# Patient Record
Sex: Female | Born: 1955 | Race: White | Hispanic: No | Marital: Married | Smoking: Never smoker
Health system: Southern US, Community
[De-identification: ages and names within clinical notes are randomized; demographics above are authoritative.]

## PROBLEM LIST (undated history)

## (undated) DIAGNOSIS — I341 Nonrheumatic mitral (valve) prolapse: Secondary | ICD-10-CM

## (undated) DIAGNOSIS — I071 Rheumatic tricuspid insufficiency: Secondary | ICD-10-CM

## (undated) DIAGNOSIS — I5189 Other ill-defined heart diseases: Secondary | ICD-10-CM

## (undated) DIAGNOSIS — Z86006 Personal history of melanoma in-situ: Secondary | ICD-10-CM

## (undated) DIAGNOSIS — I493 Ventricular premature depolarization: Secondary | ICD-10-CM

## (undated) DIAGNOSIS — I499 Cardiac arrhythmia, unspecified: Secondary | ICD-10-CM

## (undated) HISTORY — PX: VAGINAL HYSTERECTOMY: SUR661

## (undated) HISTORY — PX: TONSILLECTOMY: SUR1361

## (undated) HISTORY — PX: BREAST CYST ASPIRATION: SHX578

## (undated) HISTORY — PX: APPENDECTOMY: SHX54

---

## 1998-01-09 ENCOUNTER — Inpatient Hospital Stay (HOSPITAL_COMMUNITY): Admission: EM | Admit: 1998-01-09 | Discharge: 1998-01-14 | Payer: Self-pay | Admitting: *Deleted

## 2004-01-04 ENCOUNTER — Ambulatory Visit: Payer: Self-pay | Admitting: Internal Medicine

## 2005-02-08 ENCOUNTER — Ambulatory Visit: Payer: Self-pay | Admitting: Internal Medicine

## 2006-04-03 ENCOUNTER — Ambulatory Visit: Payer: Self-pay | Admitting: Internal Medicine

## 2007-05-27 ENCOUNTER — Ambulatory Visit: Payer: Self-pay | Admitting: Internal Medicine

## 2008-07-06 ENCOUNTER — Ambulatory Visit: Payer: Self-pay | Admitting: Internal Medicine

## 2009-08-10 ENCOUNTER — Ambulatory Visit: Payer: Self-pay | Admitting: Internal Medicine

## 2010-03-09 ENCOUNTER — Inpatient Hospital Stay: Payer: Self-pay | Admitting: Surgery

## 2010-03-13 LAB — PATHOLOGY REPORT

## 2010-10-19 ENCOUNTER — Ambulatory Visit: Payer: Self-pay | Admitting: Internal Medicine

## 2012-11-04 ENCOUNTER — Ambulatory Visit: Payer: Self-pay | Admitting: Internal Medicine

## 2012-12-03 ENCOUNTER — Ambulatory Visit: Payer: Self-pay | Admitting: Internal Medicine

## 2014-06-01 ENCOUNTER — Ambulatory Visit: Payer: Self-pay | Admitting: Internal Medicine

## 2015-01-04 ENCOUNTER — Other Ambulatory Visit: Payer: Self-pay | Admitting: Internal Medicine

## 2015-01-04 DIAGNOSIS — Z1231 Encounter for screening mammogram for malignant neoplasm of breast: Secondary | ICD-10-CM

## 2015-06-02 ENCOUNTER — Ambulatory Visit: Payer: Self-pay

## 2015-06-14 ENCOUNTER — Ambulatory Visit
Admission: RE | Admit: 2015-06-14 | Discharge: 2015-06-14 | Disposition: A | Discharge status: Home / Self Care | Payer: BLUE CROSS/BLUE SHIELD | Source: Ambulatory Visit | Attending: Internal Medicine | Admitting: Internal Medicine

## 2015-06-14 DIAGNOSIS — Z1231 Encounter for screening mammogram for malignant neoplasm of breast: Secondary | ICD-10-CM | POA: Diagnosis present

## 2016-07-23 ENCOUNTER — Other Ambulatory Visit: Payer: Self-pay | Admitting: Internal Medicine

## 2016-07-23 DIAGNOSIS — Z1231 Encounter for screening mammogram for malignant neoplasm of breast: Secondary | ICD-10-CM

## 2016-08-27 ENCOUNTER — Ambulatory Visit
Admission: RE | Admit: 2016-08-27 | Discharge: 2016-08-27 | Disposition: A | Discharge status: Home / Self Care | Payer: BLUE CROSS/BLUE SHIELD | Source: Ambulatory Visit | Attending: Internal Medicine | Admitting: Internal Medicine

## 2016-08-27 DIAGNOSIS — Z1231 Encounter for screening mammogram for malignant neoplasm of breast: Secondary | ICD-10-CM | POA: Diagnosis present

## 2016-09-25 DIAGNOSIS — C439 Malignant melanoma of skin, unspecified: Secondary | ICD-10-CM

## 2016-09-25 HISTORY — DX: Malignant melanoma of skin, unspecified: C43.9

## 2017-11-06 ENCOUNTER — Other Ambulatory Visit: Payer: Self-pay | Admitting: Internal Medicine

## 2017-11-06 DIAGNOSIS — Z1231 Encounter for screening mammogram for malignant neoplasm of breast: Secondary | ICD-10-CM

## 2017-11-27 ENCOUNTER — Ambulatory Visit
Admission: RE | Admit: 2017-11-27 | Discharge: 2017-11-27 | Disposition: A | Discharge status: Home / Self Care | Payer: BLUE CROSS/BLUE SHIELD | Source: Ambulatory Visit | Attending: Internal Medicine | Admitting: Internal Medicine

## 2017-11-27 DIAGNOSIS — Z1231 Encounter for screening mammogram for malignant neoplasm of breast: Secondary | ICD-10-CM | POA: Insufficient documentation

## 2018-02-10 ENCOUNTER — Telehealth: Payer: Self-pay | Admitting: Gastroenterology

## 2018-02-10 NOTE — Telephone Encounter (Signed)
Left vm for pt to call office and schedule an apt to see Dr. Marius Ditch per her note

## 2018-02-14 ENCOUNTER — Telehealth: Payer: Self-pay | Admitting: Gastroenterology

## 2018-02-14 NOTE — Telephone Encounter (Signed)
Left vm for pt to call office and schedule apt with Dr. Marius Ditch per note

## 2018-03-03 ENCOUNTER — Encounter: Payer: Self-pay | Admitting: *Deleted

## 2018-03-24 ENCOUNTER — Ambulatory Visit
Admission: EM | Admit: 2018-03-24 | Discharge: 2018-03-24 | Disposition: A | Discharge status: Home / Self Care | Payer: BLUE CROSS/BLUE SHIELD | Attending: Family Medicine | Admitting: Family Medicine

## 2018-03-24 DIAGNOSIS — Z9109 Other allergy status, other than to drugs and biological substances: Secondary | ICD-10-CM | POA: Insufficient documentation

## 2018-03-24 DIAGNOSIS — F5104 Psychophysiologic insomnia: Secondary | ICD-10-CM | POA: Insufficient documentation

## 2018-03-24 DIAGNOSIS — F419 Anxiety disorder, unspecified: Secondary | ICD-10-CM | POA: Insufficient documentation

## 2018-03-24 DIAGNOSIS — D649 Anemia, unspecified: Secondary | ICD-10-CM | POA: Insufficient documentation

## 2018-03-24 DIAGNOSIS — M654 Radial styloid tenosynovitis [de Quervain]: Secondary | ICD-10-CM | POA: Insufficient documentation

## 2018-03-24 DIAGNOSIS — M810 Age-related osteoporosis without current pathological fracture: Secondary | ICD-10-CM | POA: Insufficient documentation

## 2018-03-24 DIAGNOSIS — S161XXA Strain of muscle, fascia and tendon at neck level, initial encounter: Secondary | ICD-10-CM | POA: Insufficient documentation

## 2018-03-24 DIAGNOSIS — K219 Gastro-esophageal reflux disease without esophagitis: Secondary | ICD-10-CM | POA: Insufficient documentation

## 2018-03-24 DIAGNOSIS — X509XXA Other and unspecified overexertion or strenuous movements or postures, initial encounter: Secondary | ICD-10-CM

## 2018-03-24 DIAGNOSIS — Z87440 Personal history of urinary (tract) infections: Secondary | ICD-10-CM | POA: Insufficient documentation

## 2018-03-24 DIAGNOSIS — F319 Bipolar disorder, unspecified: Secondary | ICD-10-CM | POA: Insufficient documentation

## 2018-03-24 DIAGNOSIS — Z915 Personal history of self-harm: Secondary | ICD-10-CM | POA: Insufficient documentation

## 2018-03-24 DIAGNOSIS — E785 Hyperlipidemia, unspecified: Secondary | ICD-10-CM | POA: Insufficient documentation

## 2018-03-24 DIAGNOSIS — Z9151 Personal history of suicidal behavior: Secondary | ICD-10-CM | POA: Insufficient documentation

## 2018-03-24 LAB — RAPID STREP SCREEN (MED CTR MEBANE ONLY): Streptococcus, Group A Screen (Direct): NEGATIVE

## 2018-03-24 NOTE — ED Provider Notes (Addendum)
[facility]    CSN: [id number] Arrival date & time: 03/24/18  1519     History   Chief Complaint Chief Complaint  Patient presents with  . Sore Throat    HPI Denise Keith is a 63 y.o. female.   HPI  63 year old female said last night she was cleaning  vases that she had won at an auction.  As she was cleaning she had a lot of dust come out of the vases which she breathed in.  States that she had violent sneezing at that point.  Began to feel pain on the left side of her throat indicating adjacent to the larynx.  She states she looked into her throat and she had noticed a white coating that she was able to be scraped free and thought that this may be thrush.  States her tongue had red spots and some white patches.  With the pain on the left side of her throat it was hurting her to swallow.  She classified the pain is a level 8 at the time.  He has gotten better progressively it is now a 1-2.  Mostly noticed when swallowing.  He still has a nasally tone to her voice.  She has had no fever or chills.  Had no shortness of breath.  Had no wheezing or stridor.         History reviewed. No pertinent past medical history.  Patient Active Problem List   Diagnosis Date Noted  . Radial styloid tenosynovitis 03/24/2018  . 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 Hyperlipidemia 03/24/2018  . History of suicide attempt 03/24/2018  . History of recurrent UTIs 03/24/2018  . GERD (gastroesophageal reflux disease) 03/24/2018  . Environmental allergies 03/24/2018  . Bipolar disorder ([place]) 03/24/2018  . Chronic insomnia 03/24/2018  . Anxiety 03/24/2018  . Anemia 03/24/2018    Past Surgical History:  Procedure Laterality Date  . BREAST CYST ASPIRATION      OB History   No obstetric history on file.      Home Medications    Prior to Admission medications   Medication Sig Start Date End Date Taking? Authorizing Provider  buPROPion (WELLBUTRIN XL) 300 MG 24  hr tablet Wellbutrin XL 300 mg 24 hr tablet, extended release  Take 1 tablet every day by oral route.   Yes [provider]  diazepam (VALIUM) 10 MG tablet Valium 10 mg tablet  Take 1 tablet 3 times a day by oral route.   Yes [provider]  lamoTRIgine (LAMICTAL) 200 MG tablet Take by mouth.   Yes [provider]    Family History Family History  Problem Relation Age of Onset  . Breast cancer Mother 14  . Cancer Mother   . Stroke Father     Social History Social History   Tobacco Use  . Smoking status: Never Smoker  . Smokeless tobacco: Never Used  Substance Use Topics  . Alcohol use: Yes  . Drug use: Not on file     Allergies   Morphine; Codeine; Nsaids; Oxycodone-acetaminophen; Prednisone; Promethazine; Promethazine hcl; Propoxyphene; Doxycycline hyclate; Penicillins; and Vancomycin   Review of Systems Review of Systems  Constitutional: Positive for activity change. Negative for appetite change, chills, fatigue and fever.  HENT: Positive for congestion and sore throat.   All other systems reviewed and are negative.    Physical Exam Triage Vital Signs ED Triage Vitals  Enc Vitals Group     BP 03/24/18 1549 (!) 144/73  Pulse Rate 03/24/18 1549 74     Resp 03/24/18 1549 18     Temp 03/24/18 1549 98.3 F (36.8 C)     Temp Source 03/24/18 1549 Oral     SpO2 03/24/18 1549 99 %     Weight 03/24/18 1552 119 lb (54 kg)     Height 03/24/18 1552 5\' 2"  (1.575 m)     Head Circumference --      Peak Flow --      Pain Score 03/24/18 1552 0     Pain Loc --      Pain Edu? --      Excl. in [place]? --    No data found.  Updated Vital Signs BP (!) 144/73 (BP Location: Right Arm)   Pulse 74   Temp 98.3 F (36.8 C) (Oral)   Resp 18   Ht 5\' 2"  (1.575 m)   Wt 119 lb (54 kg)   SpO2 99%   BMI 21.77 kg/m   Visual Acuity Right Eye Distance:   Left Eye Distance:   Bilateral Distance:    Right Eye Near:   Left Eye Near:    Bilateral  Near:     Physical Exam Vitals signs and nursing note reviewed.  Constitutional:      General: She is not in acute distress.    Appearance: She is well-developed and normal weight. She is not ill-appearing, toxic-appearing or diaphoretic.  HENT:     Head: Normocephalic and atraumatic.     Right Ear: Tympanic membrane and ear canal normal.     Left Ear: Tympanic membrane and ear canal normal.     Nose: Congestion present. No rhinorrhea.     Mouth/Throat:     Mouth: Mucous membranes are moist. No oral lesions.     Pharynx: Oropharynx is clear. Uvula midline. No pharyngeal swelling, oropharyngeal exudate, posterior oropharyngeal erythema or uvula swelling.     Tonsils: Swelling: 0 on the right. 0 on the left.     Comments: Tongue does not show evidence of red spots or white coating. Eyes:     Conjunctiva/sclera: Conjunctivae normal.  Neck:     Musculoskeletal: Normal range of motion and neck supple.     Comments: The patient has pain to palpation along the lateral aspect of the larynx but not as far lateral as the sternocleidomastoid.  Range of motion is comfortable and full.  There is no crepitus present. Pulmonary:     Effort: Pulmonary effort is normal.     Breath sounds: Normal breath sounds.  Lymphadenopathy:     Cervical: No cervical adenopathy.  Skin:    General: Skin is warm and dry.  Neurological:     General: No focal deficit present.     Mental Status: She is alert and oriented to person, place, and time.  Psychiatric:        Mood and Affect: Mood normal.        Behavior: Behavior normal.      UC Treatments / Results  Labs (all labs ordered are listed, but only abnormal results are displayed) Labs Reviewed  RAPID STREP SCREEN ([facility] ONLY)  CULTURE, GROUP A STREP [facility])    EKG None  Radiology No results found.  Procedures Procedures (including critical care time)  Medications Ordered in UC Medications - No data to display  Initial Impression  / Assessment and Plan / UC Course  I have reviewed the triage vital signs and the nursing notes.  Pertinent  labs & imaging results that were available during my care of the patient were reviewed by me and considered in my medical decision making (see chart for details).   I have reassured the patient that she has minimal findings on exam.  Is very likely that she may have strained a muscle.  It appears to be improving.  The nasal congestion I have recommended Flonase nasal spray 2 sprays daily for the next week or 2.   Final Clinical Impressions(s) / UC Diagnoses   Final diagnoses:  Neck muscle strain, initial encounter   Discharge Instructions   None    ED Prescriptions    None     Controlled Substance Prescriptions [place] Controlled Substance Registry consulted? Not Applicable   Lorin Picket, PA-C 03/24/18 1737    Lorin Picket, PA-C 03/24/18 1739

## 2018-03-24 NOTE — ED Triage Notes (Signed)
Pt states she sneezed real hard last night and felt something pop in her throat and has been feeling pain on the left side of her throat since. Did look in her throat and thinks she has thrush. Tonsils are not swollen, but tongue does have red spots and some white patches.

## 2018-03-27 LAB — CULTURE, GROUP A STREP (THRC)

## 2020-03-11 ENCOUNTER — Ambulatory Visit: Admit: 2020-03-11 | Discharge status: Against Medical Advice | Payer: BLUE CROSS/BLUE SHIELD

## 2020-06-13 ENCOUNTER — Other Ambulatory Visit: Payer: Self-pay | Admitting: Internal Medicine

## 2020-06-13 DIAGNOSIS — Z1231 Encounter for screening mammogram for malignant neoplasm of breast: Secondary | ICD-10-CM

## 2020-07-04 ENCOUNTER — Other Ambulatory Visit: Payer: Self-pay

## 2020-07-04 ENCOUNTER — Ambulatory Visit
Admission: RE | Admit: 2020-07-04 | Discharge: 2020-07-04 | Disposition: A | Discharge status: Home / Self Care | Payer: 59 | Source: Ambulatory Visit | Attending: Internal Medicine | Admitting: Internal Medicine

## 2020-07-04 DIAGNOSIS — Z1231 Encounter for screening mammogram for malignant neoplasm of breast: Secondary | ICD-10-CM | POA: Insufficient documentation

## 2020-07-06 ENCOUNTER — Other Ambulatory Visit: Payer: Self-pay | Admitting: Internal Medicine

## 2020-07-06 DIAGNOSIS — R928 Other abnormal and inconclusive findings on diagnostic imaging of breast: Secondary | ICD-10-CM

## 2020-07-06 DIAGNOSIS — R921 Mammographic calcification found on diagnostic imaging of breast: Secondary | ICD-10-CM

## 2020-07-07 ENCOUNTER — Other Ambulatory Visit: Payer: Self-pay

## 2020-07-07 ENCOUNTER — Ambulatory Visit
Admission: RE | Admit: 2020-07-07 | Discharge: 2020-07-07 | Disposition: A | Discharge status: Home / Self Care | Payer: 59 | Source: Ambulatory Visit | Attending: Internal Medicine | Admitting: Internal Medicine

## 2020-07-07 DIAGNOSIS — R928 Other abnormal and inconclusive findings on diagnostic imaging of breast: Secondary | ICD-10-CM | POA: Diagnosis present

## 2020-07-07 DIAGNOSIS — R921 Mammographic calcification found on diagnostic imaging of breast: Secondary | ICD-10-CM | POA: Diagnosis present

## 2020-11-22 ENCOUNTER — Ambulatory Visit (INDEPENDENT_AMBULATORY_CARE_PROVIDER_SITE_OTHER): Payer: 59 | Admitting: Dermatology

## 2020-11-22 ENCOUNTER — Other Ambulatory Visit: Payer: Self-pay

## 2020-11-22 DIAGNOSIS — L578 Other skin changes due to chronic exposure to nonionizing radiation: Secondary | ICD-10-CM

## 2020-11-22 DIAGNOSIS — L814 Other melanin hyperpigmentation: Secondary | ICD-10-CM

## 2020-11-22 DIAGNOSIS — Z86006 Personal history of melanoma in-situ: Secondary | ICD-10-CM | POA: Diagnosis not present

## 2020-11-22 DIAGNOSIS — Z1283 Encounter for screening for malignant neoplasm of skin: Secondary | ICD-10-CM | POA: Diagnosis not present

## 2020-11-22 DIAGNOSIS — D229 Melanocytic nevi, unspecified: Secondary | ICD-10-CM

## 2020-11-22 DIAGNOSIS — L821 Other seborrheic keratosis: Secondary | ICD-10-CM

## 2020-11-22 DIAGNOSIS — L820 Inflamed seborrheic keratosis: Secondary | ICD-10-CM | POA: Diagnosis not present

## 2020-11-22 DIAGNOSIS — L409 Psoriasis, unspecified: Secondary | ICD-10-CM | POA: Diagnosis not present

## 2020-11-22 DIAGNOSIS — D1800 Hemangioma unspecified site: Secondary | ICD-10-CM

## 2020-11-22 MED ORDER — CLOBETASOL PROPIONATE 0.05 % EX SOLN: 1.0000 "application " | CUTANEOUS | 2 refills | Status: DC

## 2020-11-22 NOTE — Patient Instructions (Addendum)
Cryotherapy Aftercare  Wash gently with soap and water everyday.   Apply Vaseline and Band-Aid daily until healed.    Shampoos can use for psoriasis  Salicylic acid (Neutrogena T/Sal, Sebulex, Scalpicin, Denorex Extra Strength) Zinc pyrithione (Head & Shoulders white bottle, Denorex Daily, DHS Zinc, Pantene Pro-V Pyrithione Zinc) Selenium sulfide (Head & Shoulders blue bottle, Selsun Blue, Exsel Lotion Shampoo, Glo-Sel) Coal tar (Neutrogena T/Gal, Pentrax, Zetar, Tegrin, Viacom, Therapeutic Denorex) Ketoconazole (Nizoral)  I   When you use a dandruff shampoo, rub the shampoo into your wet hair and massage into scalp thoroughly.  Let it stay on your hair and scalp for 5 minutes before rinsing.  If you have involvement in the eyebrows or face, you can lather those areas with the medicated shampoo as well, or use a medicated soap (ZNP-bar, Polytar Soap, SAStid, or sulfur soap).    If the wash or shampoo alone does not help, your doctor might want you to use a prescription medication once or twice a day.  Leave-in medications for the scalp are best applied by massaging into the scalp immediately after towel drying your hair, but may be applied even if you have not washed your hair.  Seborrheic Keratosis  What causes seborrheic keratoses? Seborrheic keratoses are harmless, common skin growths that first appear during adult life.  As time goes by, more growths appear.  Some people may develop a large number of them.  Seborrheic keratoses appear on both covered and uncovered body parts.  They are not caused by sunlight.  The tendency to develop seborrheic keratoses can be inherited.  They vary in color from skin-colored to gray, brown, or even black.  They can be either smooth or have a rough, warty surface.   Seborrheic keratoses are superficial and look as if they were stuck on the skin.  Under the microscope this type of keratosis looks like layers upon layers of skin.  That is why at times the  top layer may seem to fall off, but the rest of the growth remains and re-grows.    Treatment Seborrheic keratoses do not need to be treated, but can easily be removed in the office.  Seborrheic keratoses often cause symptoms when they rub on clothing or jewelry.  Lesions can be in the way of shaving.  If they become inflamed, they can cause itching, soreness, or burning.  Removal of a seborrheic keratosis can be accomplished by freezing, burning, or surgery. If any spot bleeds, scabs, or grows rapidly, please return to have it checked, as these can be an indication of a skin cancer.    Melanoma ABCDEs  Melanoma is the most dangerous type of skin cancer, and is the leading cause of death from skin disease.  You are more likely to develop melanoma if you: Have light-colored skin, light-colored eyes, or red or blond hair Spend a lot of time in the sun Tan regularly, either outdoors or in a tanning bed Have had blistering sunburns, especially during childhood Have a close family member who has had a melanoma Have atypical moles or large birthmarks  Early detection of melanoma is key since treatment is typically straightforward and cure rates are extremely high if we catch it early.   The first sign of melanoma is often a change in a mole or a new dark spot.  The ABCDE system is a way of remembering the signs of melanoma.  A for asymmetry:  The two halves do not match. B for border:  The edges of the growth are irregular. C for color:  A mixture of colors are present instead of an even brown color. D for diameter:  Melanomas are usually (but not always) greater than 87mm - the size of a pencil eraser. E for evolution:  The spot keeps changing in size, shape, and color.  Please check your skin once per month between visits. You can use a small mirror in front and a large mirror behind you to keep an eye on the back side or your body.   If you see any new or changing lesions before your next  follow-up, please call to schedule a visit.  Please continue daily skin protection including broad spectrum sunscreen SPF 30+ to sun-exposed areas, reapplying every 2 hours as needed when you're outdoors.   Staying in the shade or wearing long sleeves, sun glasses (UVA+UVB protection) and wide brim hats (4-inch brim around the entire circumference of the hat) are also recommended for sun protection.    If you have any questions or concerns for your doctor, please call our main line at [phone] and press option 4 to reach your doctor's medical assistant. If no one answers, please leave a voicemail as directed and we will return your call as soon as possible. Messages left after 4 pm will be answered the following business day.   You may also send [place] a message via [place]. We typically respond to MyChart messages within 1-2 business days.  For prescription refills, please ask your pharmacy to contact our office. Our fax number is [phone].  If you have an urgent issue when the clinic is closed that cannot wait until the next business day, you can page your doctor at the number below.    Please note that while we do our best to be available for urgent issues outside of office hours, we are not available 24/7.   If you have an urgent issue and are unable to reach [place], you may choose to seek medical care at your doctor's office, retail clinic, urgent care center, or emergency room.  If you have a medical emergency, please immediately call 911 or go to the emergency department.  Pager Numbers  - Dr. Nehemiah Massed: [phone]  - Dr. Laurence Ferrari: [phone]  - Dr. Nicole Kindred: [phone]  In the event of inclement weather, please call our main line at [phone] for an update on the status of any delays or closures.  Dermatology Medication Tips: Please keep the boxes that topical medications come in in order to help keep track of the instructions about where and how to use these. Pharmacies  typically print the medication instructions only on the boxes and not directly on the medication tubes.   If your medication is too expensive, please contact our office at [phone] option 4 or send [place] a message through [place].   We are unable to tell what your co-pay for medications will be in advance as this is different depending on your insurance coverage. However, we may be able to find a substitute medication at lower cost or fill out paperwork to get insurance to cover a needed medication.   If a prior authorization is required to get your medication covered by your insurance company, please allow [place] 1-2 business days to complete this process.  Drug prices often vary depending on where the prescription is filled and some pharmacies may offer cheaper prices.  The website www.goodrx.com contains coupons for medications through different pharmacies. The prices here do not account  for what the cost may be with help from insurance (it may be cheaper with your insurance), but the website can give you the price if you did not use any insurance.  - You can print the associated coupon and take it with your prescription to the pharmacy.  - You may also stop by our office during regular business hours and pick up a GoodRx coupon card.  - If you need your prescription sent electronically to a different pharmacy, notify our office through [facility] or by phone at [phone] option 4.

## 2020-11-22 NOTE — Progress Notes (Signed)
Follow-Up Visit   Subjective  Denise Keith is a 65 y.o. female who presents for the following: Follow-up (Patient reports she is here today for tbse. Patient has a place at nose she would like checked and something at right lower leg she would like checked. ).  It has been getting darker and she is concerned.  Patient here for full body skin exam and skin cancer screening.   The following portions of the chart were reviewed this encounter and updated as appropriate:      Review of Systems: No other skin or systemic complaints except as noted in HPI or Assessment and Plan.   Objective  Well appearing patient in no apparent distress; mood and affect are within normal limits.  A full examination was performed including scalp, head, eyes, ears, nose, lips, neck, chest, axillae, abdomen, back, buttocks, bilateral upper extremities, bilateral lower extremities, hands, feet, fingers, toes, fingernails, and toenails. All findings within normal limits unless otherwise noted below.  right occipital scalp Pink scaly patch at right occipital scalp   Right Pretibia 3.43mm waxy brown macule with darker center       Assessment & Plan  Psoriasis right occipital scalp  Vs Seb Derm  Start Clobetasol solution qd/bid to aa scalp prn itchy rash Recommend OTC medicated shampoo (H&S, Tgel)  Seborrheic Dermatitis  -  is a chronic persistent rash characterized by pinkness and scaling most commonly of the mid face but also can occur on the scalp (dandruff), ears; mid chest, mid back and groin.  It tends to be exacerbated by stress and cooler weather.  People who have neurologic disease may experience new onset or exacerbation of existing seborrheic dermatitis.  The condition is not curable but treatable and can be controlled.   Psoriasis is a chronic non-curable, but treatable genetic/hereditary disease that may have other systemic features affecting other organ systems such as joints  (Psoriatic Arthritis). It is associated with an increased risk of inflammatory bowel disease, heart disease, non-alcoholic fatty liver disease, and depression.           clobetasol (TEMOVATE) 0.05 % external solution - right occipital scalp Apply 1 application topically See admin instructions. Use 1 - 2 times daily for itchy rash at scalp  Inflamed seborrheic keratosis Right Pretibia  Recheck on f/up  Destruction of lesion - Right Pretibia  Destruction method: cryotherapy   Informed consent: discussed and consent obtained   Lesion destroyed using liquid nitrogen: Yes   Region frozen until ice ball extended beyond lesion: Yes   Outcome: patient tolerated procedure well with no complications   Post-procedure details: wound care instructions given   Additional details:  Prior to procedure, discussed risks of blister formation, small wound, skin dyspigmentation, or rare scar following cryotherapy. Recommend Vaseline ointment to treated areas while healing.   Lentigines - Scattered tan macules, including nose - Due to sun exposure - Benign-appearing, observe - Recommend daily broad spectrum sunscreen SPF 30+ to sun-exposed areas, reapply every 2 hours as needed. - Call for any changes  Seborrheic Keratoses - Stuck-on, waxy, tan-brown macules and papules  - Benign-appearing - Discussed benign etiology and prognosis. - Observe - Call for any changes  Melanocytic Nevi - Tan-brown and/or pink-flesh-colored symmetric macules and papules, Right chest, right back, L neck - Benign appearing on exam today - Observation, discussed shave removal if irritated - Call clinic for new or changing moles - Recommend daily use of broad spectrum spf 30+ sunscreen to sun-exposed areas.  Hemangiomas - Red papules - Discussed benign nature - Observe - Call for any changes  Actinic Damage - Chronic condition, secondary to cumulative UV/sun exposure - diffuse scaly erythematous macules with  underlying dyspigmentation - Recommend daily broad spectrum sunscreen SPF 30+ to sun-exposed areas, reapply every 2 hours as needed.  - Staying in the shade or wearing long sleeves, sun glasses (UVA+UVB protection) and wide brim hats (4-inch brim around the entire circumference of the hat) are also recommended for sun protection.  - Call for new or changing lesions.  History of Melanoma in Situ - No evidence of recurrence today (2018) at chest, thickening at central scar consistent with hypertrophic scar - Recommend regular full body skin exams - Recommend daily broad spectrum sunscreen SPF 30+ to sun-exposed areas, reapply every 2 hours as needed.  - Call if any new or changing lesions are noted between office visits  Skin cancer screening performed today.  Return in about 6 months (around 05/22/2021) for tbse.  I, Ruthell Rummage, CMA, am acting as scribe for Brendolyn Patty, MD.  Documentation: I have reviewed the above documentation for accuracy and completeness, and I agree with the above.  Brendolyn Patty MD

## 2020-11-28 NOTE — Addendum Note (Signed)
Addended by: Brendolyn Patty on: 11/28/2020 09:18 AM   Modules accepted: Level of Service

## 2021-03-07 DIAGNOSIS — J029 Acute pharyngitis, unspecified: Secondary | ICD-10-CM | POA: Diagnosis not present

## 2021-03-29 DIAGNOSIS — Z1211 Encounter for screening for malignant neoplasm of colon: Secondary | ICD-10-CM | POA: Diagnosis not present

## 2021-05-30 ENCOUNTER — Ambulatory Visit: Payer: 59 | Admitting: Dermatology

## 2021-05-30 ENCOUNTER — Other Ambulatory Visit: Payer: Self-pay

## 2021-05-30 DIAGNOSIS — D1800 Hemangioma unspecified site: Secondary | ICD-10-CM

## 2021-05-30 DIAGNOSIS — Z86006 Personal history of melanoma in-situ: Secondary | ICD-10-CM

## 2021-05-30 DIAGNOSIS — L814 Other melanin hyperpigmentation: Secondary | ICD-10-CM

## 2021-05-30 DIAGNOSIS — Z1283 Encounter for screening for malignant neoplasm of skin: Secondary | ICD-10-CM | POA: Diagnosis not present

## 2021-05-30 DIAGNOSIS — L821 Other seborrheic keratosis: Secondary | ICD-10-CM

## 2021-05-30 DIAGNOSIS — L578 Other skin changes due to chronic exposure to nonionizing radiation: Secondary | ICD-10-CM | POA: Diagnosis not present

## 2021-05-30 DIAGNOSIS — D229 Melanocytic nevi, unspecified: Secondary | ICD-10-CM

## 2021-05-30 DIAGNOSIS — Z86018 Personal history of other benign neoplasm: Secondary | ICD-10-CM

## 2021-05-30 DIAGNOSIS — L409 Psoriasis, unspecified: Secondary | ICD-10-CM

## 2021-05-30 NOTE — Patient Instructions (Addendum)
? ? ?Seborrheic Keratosis ? ?What causes seborrheic keratoses? ?Seborrheic keratoses are harmless, common skin growths that first appear during adult life.  As time goes by, more growths appear.  Some people may develop a large number of them.  Seborrheic keratoses appear on both covered and uncovered body parts.  They are not caused by sunlight.  The tendency to develop seborrheic keratoses can be inherited.  They vary in color from skin-colored to gray, brown, or even black.  They can be either smooth or have a rough, warty surface.   ?Seborrheic keratoses are superficial and look as if they were stuck on the skin.  Under the microscope this type of keratosis looks like layers upon layers of skin.  That is why at times the top layer may seem to fall off, but the rest of the growth remains and re-grows.   ? ?Treatment ?Seborrheic keratoses do not need to be treated, but can easily be removed in the office.  Seborrheic keratoses often cause symptoms when they rub on clothing or jewelry.  Lesions can be in the way of shaving.  If they become inflamed, they can cause itching, soreness, or burning.  Removal of a seborrheic keratosis can be accomplished by freezing, burning, or surgery. ?If any spot bleeds, scabs, or grows rapidly, please return to have it checked, as these can be an indication of a skin cancer. ? ? ? ?Melanoma ABCDEs ? ?Melanoma is the most dangerous type of skin cancer, and is the leading cause of death from skin disease.  You are more likely to develop melanoma if you: ?Have light-colored skin, light-colored eyes, or red or blond hair ?Spend a lot of time in the sun ?Tan regularly, either outdoors or in a tanning bed ?Have had blistering sunburns, especially during childhood ?Have a close family member who has had a melanoma ?Have atypical moles or large birthmarks ? ?Early detection of melanoma is key since treatment is typically straightforward and cure rates are extremely high if we catch it  early.  ? ?The first sign of melanoma is often a change in a mole or a new dark spot.  The ABCDE system is a way of remembering the signs of melanoma. ? ?A for asymmetry:  The two halves do not match. ?B for border:  The edges of the growth are irregular. ?C for color:  A mixture of colors are present instead of an even brown color. ?D for diameter:  Melanomas are usually (but not always) greater than 75m - the size of a pencil eraser. ?E for evolution:  The spot keeps changing in size, shape, and color. ? ?Please check your skin once per month between visits. You can use a small mirror in front and a large mirror behind you to keep an eye on the back side or your body.  ? ?If you see any new or changing lesions before your next follow-up, please call to schedule a visit. ? ?Please continue daily skin protection including broad spectrum sunscreen SPF 30+ to sun-exposed areas, reapplying every 2 hours as needed when you're outdoors.  ? ?Staying in the shade or wearing long sleeves, sun glasses (UVA+UVB protection) and wide brim hats (4-inch brim around the entire circumference of the hat) are also recommended for sun protection.   ? ?If You Need Anything After Your Visit ? ?If you have any questions or concerns for your doctor, please call our main line at [phone] press option 4 to reach your doctor's medical  assistant. If no one answers, please leave a voicemail as directed and we will return your call as soon as possible. Messages left after 4 pm will be answered the following business day.  ? ?You may also send [place] a message via MyChart. We typically respond to MyChart messages within 1-2 business days. ? ?For prescription refills, please ask your pharmacy to contact our office. Our fax number is [phone]. ? ?If you have an urgent issue when the clinic is closed that cannot wait until the next business day, you can page your doctor at the number below.   ? ?Please note that while we do our best to be  available for urgent issues outside of office hours, we are not available 24/7.  ? ?If you have an urgent issue and are unable to reach [place], you may choose to seek medical care at your doctor's office, retail clinic, urgent care center, or emergency room. ? ?If you have a medical emergency, please immediately call 911 or go to the emergency department. ? ?Pager Numbers ? ?- Dr. Nehemiah Massed: [phone] ? ?- Dr. Laurence Ferrari: [phone] ? ?- Dr. Nicole Kindred: [phone] ? ?In the event of inclement weather, please call our main line at [phone] for an update on the status of any delays or closures. ? ?Dermatology Medication Tips: ?Please keep the boxes that topical medications come in in order to help keep track of the instructions about where and how to use these. Pharmacies typically print the medication instructions only on the boxes and not directly on the medication tubes.  ? ?If your medication is too expensive, please contact our office at [phone] option 4 or send [place] a message through [place].  ? ?We are unable to tell what your co-pay for medications will be in advance as this is different depending on your insurance coverage. However, we may be able to find a substitute medication at lower cost or fill out paperwork to get insurance to cover a needed medication.  ? ?If a prior authorization is required to get your medication covered by your insurance company, please allow [place] 1-2 business days to complete this process. ? ?Drug prices often vary depending on where the prescription is filled and some pharmacies may offer cheaper prices. ? ?The website www.goodrx.com contains coupons for medications through different pharmacies. The prices here do not account for what the cost may be with help from insurance (it may be cheaper with your insurance), but the website can give you the price if you did not use any insurance.  ?- You can print the associated coupon and take it with your prescription to the pharmacy.  ?-  You may also stop by our office during regular business hours and pick up a GoodRx coupon card.  ?- If you need your prescription sent electronically to a different pharmacy, notify our office through [facility] or by phone at [phone] option 4. ? ? ? ? ?Si Usted Necesita Algo Despu?s de Su Visita ? ?Tambi?n puede enviarnos un mensaje a trav?s de MyChart. Por lo general respondemos a los mensajes de MyChart en el transcurso de 1 a 2 d?as h?biles. ? ?Para renovar recetas, por favor pida a su farmacia que se ponga en contacto con nuestra oficina. Nuestro n?mero de fax es el [phone]. ? ?Si tiene un asunto urgente cuando la cl?nica est? cerrada y que no puede esperar hasta el siguiente d?a h?bil, puede llamar/localizar a su doctor(a) al n?mero que aparece a continuaci?n.  ? ?Por favor,  tenga en cuenta que aunque hacemos todo lo posible para estar disponibles para asuntos urgentes fuera del horario de oficina, no estamos disponibles las 24 horas del d?a, los 7 d?as de la semana.  ? ?Si tiene un problema urgente y no puede comunicarse con nosotros, puede optar por buscar atenci?n m?dica  en el consultorio de su doctor(a), en una cl?nica privada, en un centro de atenci?n urgente o en una sala de emergencias. ? ?Si tiene Engineer, maintenance (IT) m?dica, por favor llame inmediatamente al 911 o vaya a la sala de emergencias. ? ?N?meros de b?per ? ?- Dr. Nehemiah Massed: [phone] ? ?- Dra. Moye: [phone] ? ?- Dra. Nicole Kindred: [phone] ? ?En caso de inclemencias del tiempo, por favor llame a nuestra l?nea principal al [phone] para una actualizaci?n sobre el estado de cualquier retraso o cierre. ? ?Consejos para la medicaci?n en dermatolog?a: ?Por favor, guarde las cajas en las que vienen los medicamentos de uso t?pico para ayudarle a seguir las instrucciones sobre d?nde y c?mo usarlos. Las farmacias generalmente imprimen las instrucciones del medicamento s?lo en las cajas y no directamente en los tubos del  [place].  ? ?Si su medicamento es muy caro, por favor, p?ngase en contacto con Zigmund Daniel llamando al [phone] y presione la opci?n 4 o env?enos un mensaje a trav?s de MyChart.  ? ?No podemos decirl

## 2021-05-30 NOTE — Progress Notes (Signed)
? ?Follow-Up Visit ?  ?Subjective  ?Denise Keith is a 66 y.o. female who presents for the following: Follow-up (Patient here today for 6 month follow up on psoriasis, tbse. Patient states scalp has gotten better with itch and she is just using clobetasol as needed but is not currently using. Patient is also here for 6 month tbse. She has history of melanoma in situ 2018. ). ? ?The patient presents for Total-Body Skin Exam (TBSE) for skin cancer screening and mole check.  The patient has spots, moles and lesions to be evaluated, some may be new or changing and the patient has concerns that these could be cancer. ? ? ?The following portions of the chart were reviewed this encounter and updated as appropriate:   ?  ? ?Review of Systems: No other skin or systemic complaints except as noted in HPI or Assessment and Plan. ? ? ?Objective  ?Well appearing patient in no apparent distress; mood and affect are within normal limits. ? ?A full examination was performed including scalp, head, eyes, ears, nose, lips, neck, chest, axillae, abdomen, back, buttocks, bilateral upper extremities, bilateral lower extremities, hands, feet, fingers, toes, fingernails, and toenails. All findings within normal limits unless otherwise noted below. ? ?Scalp ? mild erythema at occipital scalp  ? ? ?Assessment & Plan  ?Psoriasis ?Scalp ? ?Scalp psoriasis Vs Seb Derm ?Chronic condition with duration or expected duration over one year. Currently well-controlled.  ? ?Continue  as needed Clobetasol solution qd/bid to aa scalp prn itchy rash ? ? Seborrheic Dermatitis  ?-  is a chronic persistent rash characterized by pinkness and scaling most commonly of the mid face but also can occur on the scalp (dandruff), ears; mid chest, mid back and groin.  It tends to be exacerbated by stress and cooler weather.  People who have neurologic disease may experience new onset or exacerbation of existing seborrheic dermatitis.  The condition is not curable  but treatable and can be controlled.  ?  ? ?Related Medications ?clobetasol (TEMOVATE) 0.05 % external solution ?Apply 1 application topically See admin instructions. Use 1 - 2 times daily for itchy rash at scalp ? ?Lentigines ?- Scattered tan macules ?- Due to sun exposure ?- Benign-appearing, observe ?- Recommend daily broad spectrum sunscreen SPF 30+ to sun-exposed areas, reapply every 2 hours as needed. ?- Call for any changes ? ?Seborrheic Keratoses ?- Stuck-on, waxy, tan-brown papules and/or plaques  ?- Benign-appearing ?- Discussed benign etiology and prognosis. ?- Observe ?- Call for any changes ? ?Melanocytic Nevi ?- Tan-brown and/or pink-flesh-colored symmetric macules and papules ?- Benign appearing on exam today ?- Observation ?- Call clinic for new or changing moles ?- Recommend daily use of broad spectrum spf 30+ sunscreen to sun-exposed areas.  ? ?Hemangiomas ?- Red papules ?- Discussed benign nature ?- Observe ?- Call for any changes ? ?Actinic Damage ?- Chronic condition, secondary to cumulative UV/sun exposure ?- diffuse scaly erythematous macules with underlying dyspigmentation ?- Recommend daily broad spectrum sunscreen SPF 30+ to sun-exposed areas, reapply every 2 hours as needed.  ?- Staying in the shade or wearing long sleeves, sun glasses (UVA+UVB protection) and wide brim hats (4-inch brim around the entire circumference of the hat) are also recommended for sun protection.  ?- Call for new or changing lesions. ? ?History of Melanoma in Situ ?- No evidence of recurrence today right mid sternum excision on 8/18 ?- Recommend regular full body skin exams ?- Recommend daily broad spectrum sunscreen SPF 30+ to sun-exposed  areas, reapply every 2 hours as needed.  ?- Call if any new or changing lesions are noted between office visits ? ?History of Isk at right pretibia ?- clear today at exam post cryotherapy prior visit ? ?Skin cancer screening performed today. ?Return in about 6 months (around  11/30/2021) for TBSE h/o melanoma in situ. ?I, Ruthell Rummage, CMA, am acting as scribe for Brendolyn Patty, MD. ? ?Documentation: I have reviewed the above documentation for accuracy and completeness, and I agree with the above. ? ?Brendolyn Patty MD  ? ?

## 2021-10-17 ENCOUNTER — Other Ambulatory Visit: Payer: Self-pay | Admitting: Internal Medicine

## 2021-10-17 DIAGNOSIS — Z1231 Encounter for screening mammogram for malignant neoplasm of breast: Secondary | ICD-10-CM

## 2021-11-11 IMAGING — MG MM DIGITAL DIAGNOSTIC UNILAT*R*
5 series · 6 of 9 positions shown · non-contrast
Comparison: Previous exam(s).

CLINICAL DATA: Screening recall for right breast calcifications.

EXAM:
DIGITAL DIAGNOSTIC UNILATERAL RIGHT MAMMOGRAM
TECHNIQUE: Right digital diagnostic mammography was performed. Mammographic
images were processed with CAD.

[R ML]
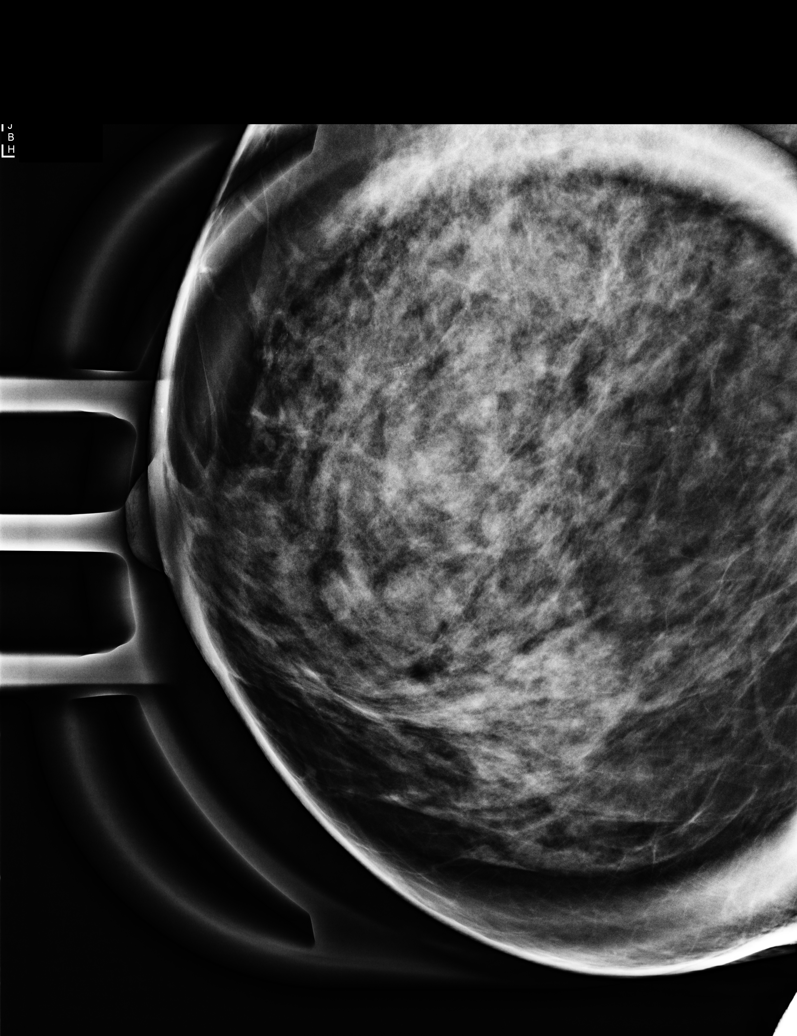

[R CC (1 of 2)]
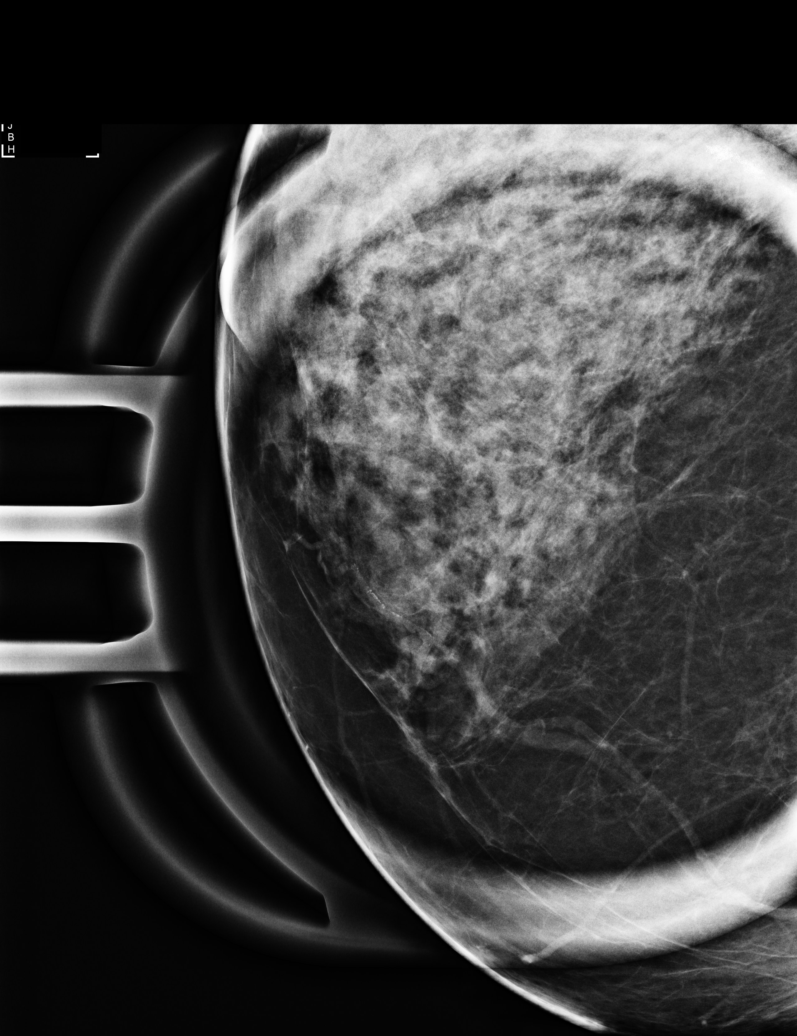

[R CC (2 of 2)]
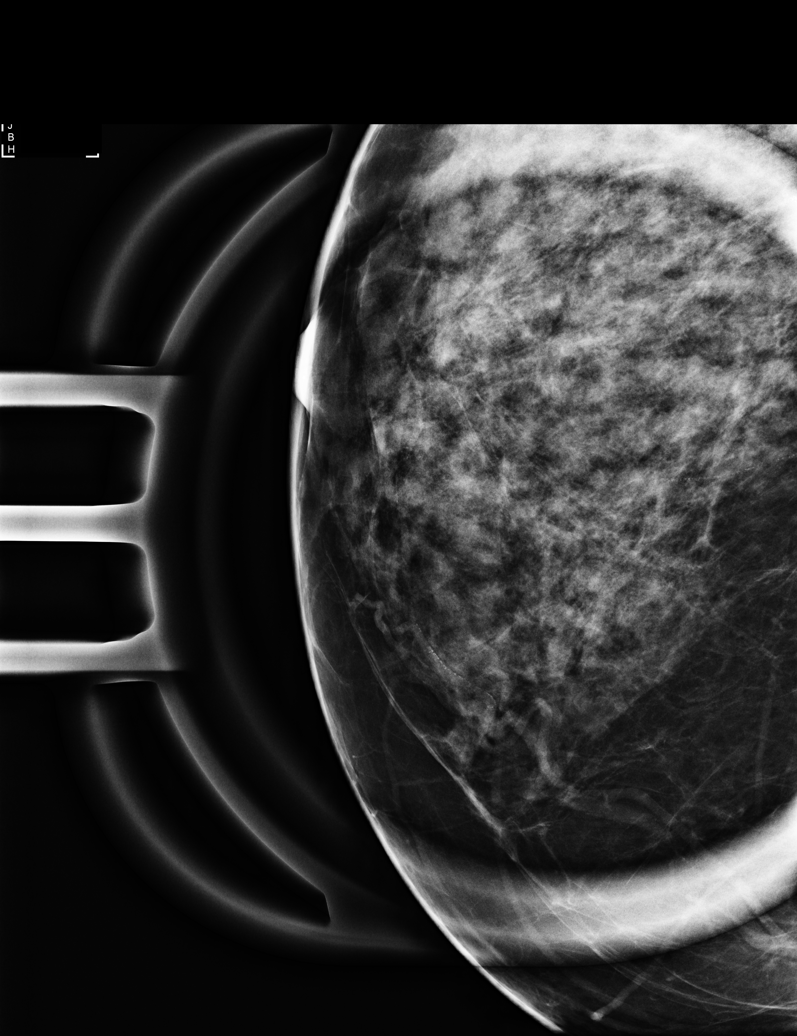

[R ML synth-2D]
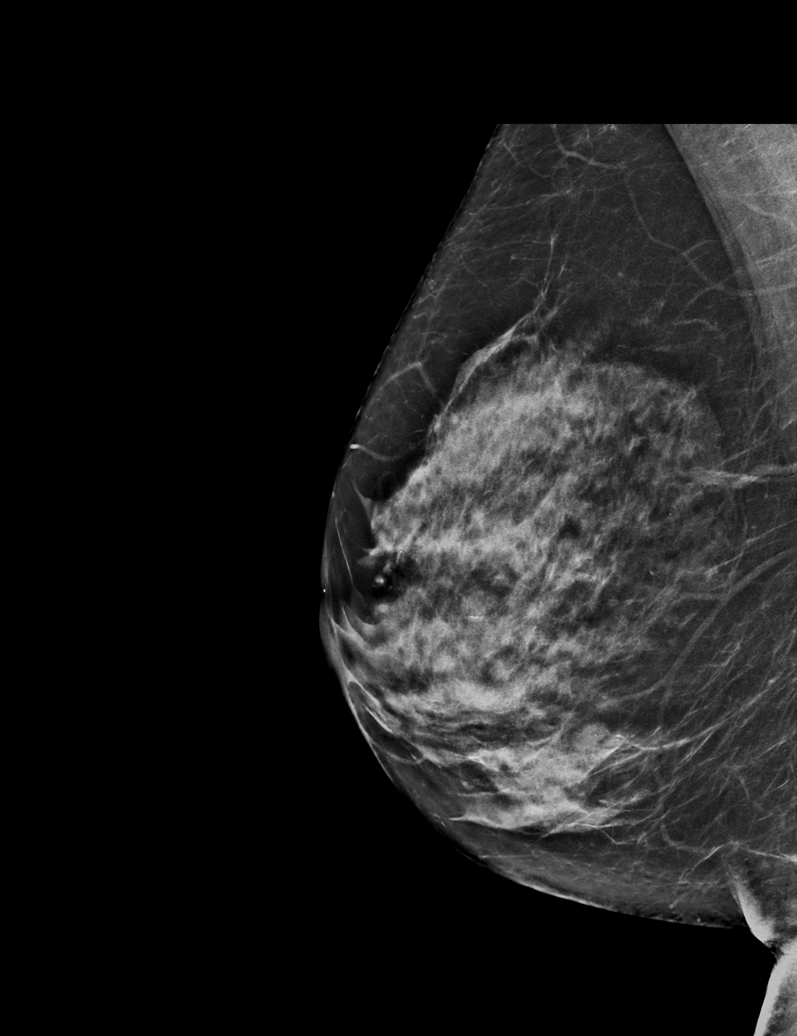

[R ML tomo · 2 of 67 frames shown]
[frame 22/67]
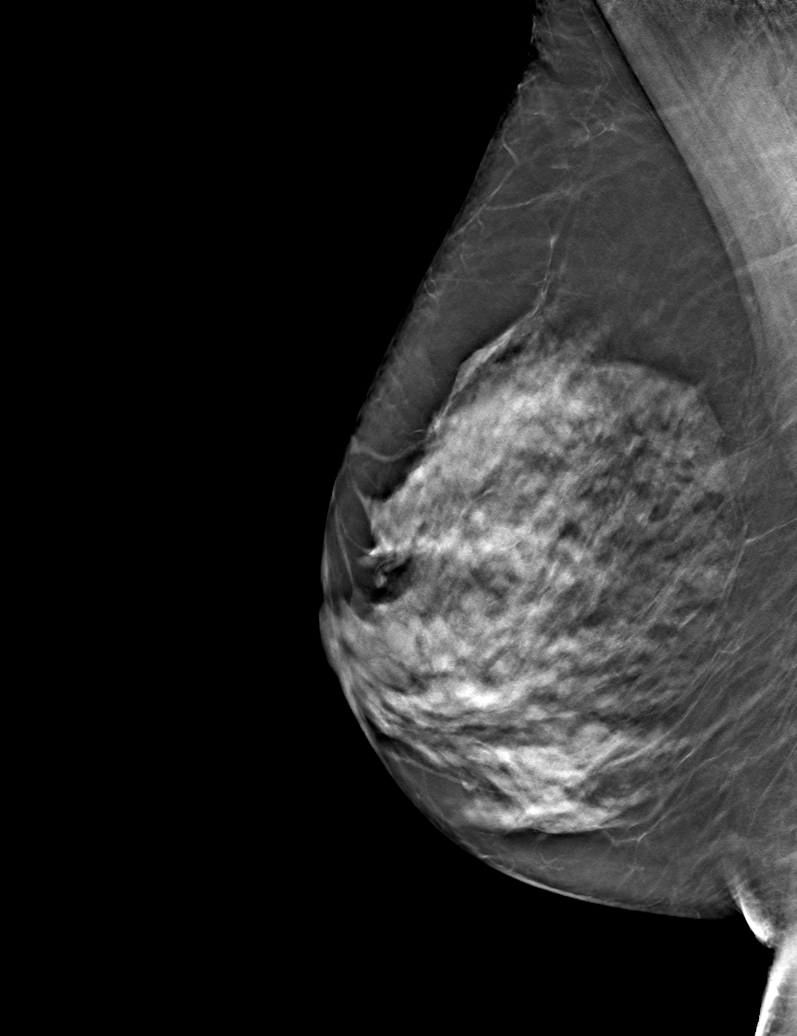
[frame 34/67]
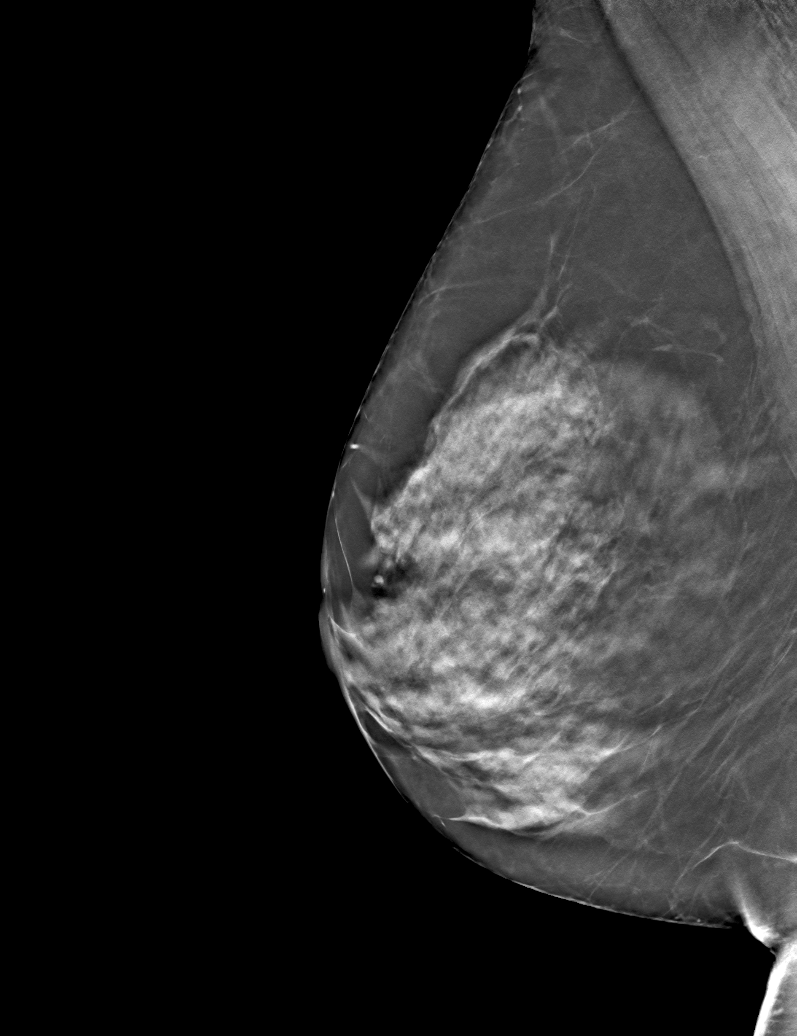

[6 of 9 positions shown; findings below may reference images not displayed]

ACR Breast Density Category c: The breast tissue is heterogeneously
dense, which may obscure small masses.
FINDINGS: The 2 groups of calcifications seen in the medial right breast
correspond with benign vascular calcifications, and are visible in a
tram track pattern along the course of a blood vessel.
IMPRESSION: Benign vascular calcifications in the right breast.

RECOMMENDATION:
Screening mammogram in one year.(Code:WW-[id number])

I have discussed the findings and recommendations with the patient.
If applicable, a reminder letter will be sent to the patient
regarding the next appointment.

BI-RADS CATEGORY  2: Benign.

## 2021-11-14 ENCOUNTER — Ambulatory Visit
Admission: RE | Admit: 2021-11-14 | Discharge: 2021-11-14 | Disposition: A | Discharge status: Home / Self Care | Payer: Medicare Other | Source: Ambulatory Visit | Attending: Internal Medicine | Admitting: Internal Medicine

## 2021-11-14 DIAGNOSIS — Z1231 Encounter for screening mammogram for malignant neoplasm of breast: Secondary | ICD-10-CM | POA: Diagnosis present

## 2021-12-26 ENCOUNTER — Ambulatory Visit: Payer: 59 | Admitting: Dermatology

## 2021-12-27 ENCOUNTER — Encounter: Payer: Self-pay | Admitting: Dermatology

## 2021-12-27 ENCOUNTER — Ambulatory Visit: Payer: Medicare Other | Admitting: Dermatology

## 2021-12-27 DIAGNOSIS — L814 Other melanin hyperpigmentation: Secondary | ICD-10-CM

## 2021-12-27 DIAGNOSIS — L578 Other skin changes due to chronic exposure to nonionizing radiation: Secondary | ICD-10-CM

## 2021-12-27 DIAGNOSIS — D229 Melanocytic nevi, unspecified: Secondary | ICD-10-CM

## 2021-12-27 DIAGNOSIS — D224 Melanocytic nevi of scalp and neck: Secondary | ICD-10-CM | POA: Diagnosis not present

## 2021-12-27 DIAGNOSIS — Z86006 Personal history of melanoma in-situ: Secondary | ICD-10-CM

## 2021-12-27 DIAGNOSIS — Z1283 Encounter for screening for malignant neoplasm of skin: Secondary | ICD-10-CM | POA: Diagnosis not present

## 2021-12-27 DIAGNOSIS — D225 Melanocytic nevi of trunk: Secondary | ICD-10-CM

## 2021-12-27 DIAGNOSIS — L820 Inflamed seborrheic keratosis: Secondary | ICD-10-CM

## 2021-12-27 DIAGNOSIS — L821 Other seborrheic keratosis: Secondary | ICD-10-CM

## 2021-12-27 DIAGNOSIS — L409 Psoriasis, unspecified: Secondary | ICD-10-CM

## 2021-12-27 MED ORDER — CLOBETASOL PROPIONATE 0.05 % EX SOLN: 1.0000 | CUTANEOUS | 6 refills | Status: DC

## 2021-12-27 NOTE — Progress Notes (Signed)
Follow-Up Visit   Subjective  Denise Keith is a 66 y.o. female who presents for the following: Annual Exam (6 month skin cancer screening. Hx MIS).  The patient presents for Total-Body Skin Exam (TBSE) for skin cancer screening and mole check.  The patient has spots, moles and lesions to be evaluated, some may be new or changing and the patient has concerns that these could be cancer.   The following portions of the chart were reviewed this encounter and updated as appropriate:      Review of Systems: No other skin or systemic complaints except as noted in HPI or Assessment and Plan.   Objective  Well appearing patient in no apparent distress; mood and affect are within normal limits.  A full examination was performed including scalp, head, eyes, ears, nose, lips, neck, chest, axillae, abdomen, back, buttocks, bilateral upper extremities, bilateral lower extremities, hands, feet, fingers, toes, fingernails, and toenails. All findings within normal limits unless otherwise noted below.  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  - Call if any new or changing lesions are noted between office visits   Inflamed seborrheic keratosis Right pretibial x1  Symptomatic, irritating, patient would like treated.  Destruction of lesion - Right pretibial x1  Destruction method: cryotherapy   Informed consent: discussed and consent obtained   Lesion destroyed using liquid nitrogen: Yes   Region frozen until ice ball extended beyond lesion: Yes   Outcome:  patient tolerated procedure well with no complications   Post-procedure details: wound care instructions given   Additional details:  Prior to procedure, discussed risks of blister formation, small wound, skin dyspigmentation, or rare scar following cryotherapy. Recommend Vaseline ointment to treated areas while healing.   Nevus Left Anterior Neck, back  Benign-appearing.  Observation.  Call clinic for new or changing lesions.  Recommend daily use of broad spectrum spf 30+ sunscreen to sun-exposed areas.    Psoriasis Occipital Scalp  Scalp psoriasis Vs Seb Derm  Chronic condition with duration or expected duration over one year. Currently well-controlled.    Continue  as needed Clobetasol solution qd/bid to aa scalp prn itchy rash    Seborrheic Dermatitis  -  is a chronic persistent rash characterized by pinkness and scaling most commonly of the mid face but also can occur on the scalp (dandruff), ears; mid chest, mid back and groin.  It tends to be exacerbated by stress and cooler weather.  People who have neurologic disease may experience new onset or exacerbation of existing seborrheic dermatitis.  The condition is not curable but treatable and can be controlled.   Psoriasis is a chronic non-curable, but treatable genetic/hereditary disease that may have other systemic features affecting other organ systems such as joints (Psoriatic Arthritis). It is associated with an increased risk of inflammatory bowel disease, heart disease, non-alcoholic fatty liver disease, and depression.    Recommend using medicated shampoo. Vanicream with zinc sample given.   Related Medications clobetasol (TEMOVATE) 0.05 % external solution Apply 1 Application topically See admin instructions. Use 1 - 2 times daily for itchy rash  at scalp   Lentigines. Chest, back - Scattered tan macules - Due to sun exposure - Benign-appearing, observe - Recommend daily broad spectrum sunscreen SPF 30+ to sun-exposed  areas, reapply every 2 hours as needed. - Call for any changes  Seborrheic Keratoses - Stuck-on, waxy, tan-brown papules and/or plaques  - Benign-appearing - Discussed benign etiology and prognosis. - Observe - Call for any changes  Melanocytic Nevi. Chest, back - Tan-brown and/or pink-flesh-colored symmetric macules and papules - Benign appearing on exam today - Observation - Call clinic for new or changing moles - Recommend daily use of broad spectrum spf 30+ sunscreen to sun-exposed areas.   Hemangiomas. Torso. - Red papules - Discussed benign nature - Observe - Call for any changes  Actinic Damage. Chest. - Chronic condition, secondary to cumulative UV/sun exposure - diffuse scaly erythematous macules with underlying dyspigmentation - Recommend daily broad spectrum sunscreen SPF 30+ to sun-exposed areas, reapply every 2 hours as needed.  - Staying in the shade or wearing long sleeves, sun glasses (UVA+UVB protection) and wide brim hats (4-inch brim around the entire circumference of the hat) are also recommended for sun protection.  - Call for new or changing lesions.  Skin cancer screening performed today.   Return in about 1 year (around 12/28/2022) for TBSE, HxMMIS.  I, Emelia Salisbury, CMA, am acting as scribe for Brendolyn Patty, MD.  Documentation: I have reviewed the above documentation for accuracy and completeness, and I agree with the above.  Brendolyn Patty MD

## 2021-12-27 NOTE — Patient Instructions (Addendum)
Scalp Continue  as needed Clobetasol solution once or twice daily to affected area on scalp as needed for itchy rash   Cryotherapy Aftercare  Wash gently with soap and water everyday.   Apply Vaseline and daily until healed.    Seborrheic Keratosis  What causes seborrheic keratoses? Seborrheic keratoses are harmless, common skin growths that first appear during adult life.  As time goes by, more growths appear.  Some people may develop a large number of them.  Seborrheic keratoses appear on both covered and uncovered body parts.  They are not caused by sunlight.  The tendency to develop seborrheic keratoses can be inherited.  They vary in color from skin-colored to gray, brown, or even black.  They can be either smooth or have a rough, warty surface.   Seborrheic keratoses are superficial and look as if they were stuck on the skin.  Under the microscope this type of keratosis looks like layers upon layers of skin.  That is why at times the top layer may seem to fall off, but the rest of the growth remains and re-grows.    Treatment Seborrheic keratoses do not need to be treated, but can easily be removed in the office.  Seborrheic keratoses often cause symptoms when they rub on clothing or jewelry.  Lesions can be in the way of shaving.  If they become inflamed, they can cause itching, soreness, or burning.  Removal of a seborrheic keratosis can be accomplished by freezing, burning, or surgery. If any spot bleeds, scabs, or grows rapidly, please return to have it checked, as these can be an indication of a skin cancer.   Recommend daily broad spectrum sunscreen SPF 30+ to sun-exposed areas, reapply every 2 hours as needed. Call for new or changing lesions.  Staying in the shade or wearing long sleeves, sun glasses (UVA+UVB protection) and wide brim hats (4-inch brim around the entire circumference of the hat) are also recommended for sun protection.    Melanoma ABCDEs  Melanoma is the most  dangerous type of skin cancer, and is the leading cause of death from skin disease.  You are more likely to develop melanoma if you: Have light-colored skin, light-colored eyes, or red or blond hair Spend a lot of time in the sun Tan regularly, either outdoors or in a tanning bed Have had blistering sunburns, especially during childhood Have a close family member who has had a melanoma Have atypical moles or large birthmarks  Early detection of melanoma is key since treatment is typically straightforward and cure rates are extremely high if we catch it early.   The first sign of melanoma is often a change in a mole or a new dark spot.  The ABCDE system is a way of remembering the signs of melanoma.  A for asymmetry:  The two halves do not match. B for border:  The edges of the growth are irregular. C for color:  A mixture of colors are present instead of an even brown color. D for diameter:  Melanomas are usually (but not always) greater than 21m - the size of a pencil eraser. E for evolution:  The spot keeps changing in size, shape, and color.  Please check your skin once per month between visits. You can use a small mirror in front and a large mirror behind you to keep an eye on the back side or your body.   If you see any new or changing lesions before your next follow-up, please call  to schedule a visit.  Please continue daily skin protection including broad spectrum sunscreen SPF 30+ to sun-exposed areas, reapplying every 2 hours as needed when you're outdoors.   Staying in the shade or wearing long sleeves, sun glasses (UVA+UVB protection) and wide brim hats (4-inch brim around the entire circumference of the hat) are also recommended for sun protection.    Due to recent changes in healthcare laws, you may see results of your pathology and/or laboratory studies on MyChart before the doctors have had a chance to review them. We understand that in some cases there may be results that  are confusing or concerning to you. Please understand that not all results are received at the same time and often the doctors may need to interpret multiple results in order to provide you with the best plan of care or course of treatment. Therefore, we ask that you please give [place] 2 business days to thoroughly review all your results before contacting the office for clarification. Should we see a critical lab result, you will be contacted sooner.   If You Need Anything After Your Visit  If you have any questions or concerns for your doctor, please call our main line at [phone] and press option 4 to reach your doctor's medical assistant. If no one answers, please leave a voicemail as directed and we will return your call as soon as possible. Messages left after 4 pm will be answered the following business day.   You may also send [place] a message via [place]. We typically respond to MyChart messages within 1-2 business days.  For prescription refills, please ask your pharmacy to contact our office. Our fax number is [phone].  If you have an urgent issue when the clinic is closed that cannot wait until the next business day, you can page your doctor at the number below.    Please note that while we do our best to be available for urgent issues outside of office hours, we are not available 24/7.   If you have an urgent issue and are unable to reach [place], you may choose to seek medical care at your doctor's office, retail clinic, urgent care center, or emergency room.  If you have a medical emergency, please immediately call 911 or go to the emergency department.  Pager Numbers  - Dr. Nehemiah Massed: [phone]  - Dr. Laurence Ferrari: [phone]  - Dr. Nicole Kindred: [phone]  In the event of inclement weather, please call our main line at [phone] for an update on the status of any delays or closures.  Dermatology Medication Tips: Please keep the boxes that topical medications come in in order  to help keep track of the instructions about where and how to use these. Pharmacies typically print the medication instructions only on the boxes and not directly on the medication tubes.   If your medication is too expensive, please contact our office at [phone] option 4 or send [place] a message through [place].   We are unable to tell what your co-pay for medications will be in advance as this is different depending on your insurance coverage. However, we may be able to find a substitute medication at lower cost or fill out paperwork to get insurance to cover a needed medication.   If a prior authorization is required to get your medication covered by your insurance company, please allow [place] 1-2 business days to complete this process.  Drug prices often vary depending on where the prescription is filled and some pharmacies  may offer cheaper prices.  The website www.goodrx.com contains coupons for medications through different pharmacies. The prices here do not account for what the cost may be with help from insurance (it may be cheaper with your insurance), but the website can give you the price if you did not use any insurance.  - You can print the associated coupon and take it with your prescription to the pharmacy.  - You may also stop by our office during regular business hours and pick up a GoodRx coupon card.  - If you need your prescription sent electronically to a different pharmacy, notify our office through [facility] or by phone at [phone] option 4.     Si Usted Necesita Algo Despus de Su Visita  Tambin puede enviarnos un mensaje a travs de Pharmacist, community. Por lo general respondemos a los mensajes de MyChart en el transcurso de 1 a 2 das hbiles.  Para renovar recetas, por favor pida a su farmacia que se ponga en contacto con nuestra oficina. Harland Dingwall de fax es [place] [phone].  Si tiene un asunto urgente cuando la clnica est cerrada y que no puede esperar  hasta el siguiente da hbil, puede llamar/localizar a su doctor(a) al nmero que aparece a continuacin.   Por favor, tenga en cuenta que aunque hacemos todo lo posible para estar disponibles para asuntos urgentes fuera del horario de [place], no estamos disponibles las 24 horas del da, los 7 das de la [place].   Si tiene un problema urgente y no puede comunicarse con nosotros, puede optar por buscar atencin mdica  en el consultorio de su doctor(a), en una clnica privada, en un centro de atencin urgente o en una sala de emergencias.  Si tiene Engineering geologist, por favor llame inmediatamente al 911 o vaya a la sala de emergencias.  Nmeros de bper  - Dr. Nehemiah Massed: [phone]  - Dra. Moye: [phone]  - Dra. Nicole Kindred: [phone]  En caso de inclemencias del [place], por favor llame a Johnsie Kindred principal al [phone] para una actualizacin sobre el [place] de cualquier retraso o cierre.  Consejos para la medicacin en dermatologa: Por favor, guarde las cajas en las que vienen los medicamentos de uso tpico para ayudarle a seguir las instrucciones sobre dnde y cmo usarlos. Las farmacias generalmente imprimen las instrucciones del medicamento slo en las cajas y no directamente en los tubos del [place].   Si su medicamento es muy caro, por favor, pngase en contacto con Zigmund Daniel llamando al [phone] y presione la opcin 4 o envenos un mensaje a travs de Pharmacist, community.   No podemos decirle cul ser su copago por los medicamentos por adelantado ya que esto es diferente dependiendo de la cobertura de su seguro. Sin embargo, es posible que podamos encontrar un medicamento sustituto a Electrical engineer un formulario para que el seguro cubra el medicamento que se considera necesario.   Si se requiere una autorizacin previa para que su compaa de seguros [place] su medicamento, por favor permtanos de 1 a 2 das hbiles para completar este proceso.  Los precios  de los medicamentos varan con frecuencia dependiendo del Environmental consultant de dnde se surte la receta y alguna farmacias pueden ofrecer precios ms baratos.  El sitio web www.goodrx.com tiene cupones para medicamentos de Airline pilot. Los precios aqu no tienen en cuenta lo que podra costar con la ayuda del seguro (puede ser ms barato con su seguro), pero el sitio web puede darle el precio si no  utiliz ningn seguro.  - Puede imprimir el cupn correspondiente y llevarlo con su receta a la farmacia.  - Tambin puede pasar por nuestra oficina durante el horario de atencin regular y recoger una tarjeta de cupones de GoodRx.  - Si necesita que su receta se enve electrnicamente a una farmacia diferente, informe a nuestra oficina a travs de MyChart de [place] o por telfono llamando al [phone] y presione la opcin 4.  

## 2022-11-06 ENCOUNTER — Other Ambulatory Visit: Payer: Self-pay | Admitting: Internal Medicine

## 2022-11-06 DIAGNOSIS — Z1231 Encounter for screening mammogram for malignant neoplasm of breast: Secondary | ICD-10-CM

## 2022-11-27 ENCOUNTER — Ambulatory Visit
Admission: RE | Admit: 2022-11-27 | Discharge: 2022-11-27 | Disposition: A | Discharge status: Home / Self Care | Payer: Medicare Other | Source: Ambulatory Visit | Attending: Internal Medicine | Admitting: Internal Medicine

## 2022-11-27 DIAGNOSIS — Z1231 Encounter for screening mammogram for malignant neoplasm of breast: Secondary | ICD-10-CM | POA: Diagnosis present

## 2023-01-07 ENCOUNTER — Ambulatory Visit: Payer: Medicare Other | Admitting: Dermatology

## 2023-01-07 DIAGNOSIS — L821 Other seborrheic keratosis: Secondary | ICD-10-CM

## 2023-01-07 DIAGNOSIS — L578 Other skin changes due to chronic exposure to nonionizing radiation: Secondary | ICD-10-CM

## 2023-01-07 DIAGNOSIS — Z86006 Personal history of melanoma in-situ: Secondary | ICD-10-CM

## 2023-01-07 DIAGNOSIS — D1801 Hemangioma of skin and subcutaneous tissue: Secondary | ICD-10-CM

## 2023-01-07 DIAGNOSIS — W908XXA Exposure to other nonionizing radiation, initial encounter: Secondary | ICD-10-CM | POA: Diagnosis not present

## 2023-01-07 DIAGNOSIS — Z1283 Encounter for screening for malignant neoplasm of skin: Secondary | ICD-10-CM | POA: Diagnosis not present

## 2023-01-07 DIAGNOSIS — D224 Melanocytic nevi of scalp and neck: Secondary | ICD-10-CM | POA: Diagnosis not present

## 2023-01-07 DIAGNOSIS — L814 Other melanin hyperpigmentation: Secondary | ICD-10-CM

## 2023-01-07 DIAGNOSIS — L409 Psoriasis, unspecified: Secondary | ICD-10-CM

## 2023-01-07 DIAGNOSIS — D225 Melanocytic nevi of trunk: Secondary | ICD-10-CM

## 2023-01-07 DIAGNOSIS — Z872 Personal history of diseases of the skin and subcutaneous tissue: Secondary | ICD-10-CM

## 2023-01-07 DIAGNOSIS — D229 Melanocytic nevi, unspecified: Secondary | ICD-10-CM

## 2023-01-07 NOTE — Progress Notes (Signed)
Follow-Up Visit   Subjective  Denise Keith is a 67 y.o. female who presents for the following: Skin Cancer Screening and Full Body Skin Exam Hx of melanoma , hx of psoriasis, hx of isks   The patient presents for Total-Body Skin Exam (TBSE) for skin cancer screening and mole check. The patient has spots, moles and lesions to be evaluated, some may be new or changing and the patient may have concern these could be cancer.  The following portions of the chart were reviewed this encounter and updated as appropriate: medications, allergies, medical history  Review of Systems:  No other skin or systemic complaints except as noted in HPI or Assessment and Plan.  Objective  Well appearing patient in no apparent distress; mood and affect are within normal limits.  A full examination was performed including scalp, head, eyes, ears, nose, lips, neck, chest, axillae, abdomen, back, buttocks, bilateral upper extremities, bilateral lower extremities, hands, feet, fingers, toes, fingernails, and toenails. All findings within normal limits unless otherwise noted below.   Relevant physical exam findings are noted in the Assessment and Plan.    Assessment & Plan   SKIN CANCER SCREENING PERFORMED TODAY.  ACTINIC DAMAGE - Chronic condition, secondary to cumulative UV/sun exposure - diffuse scaly erythematous macules with underlying dyspigmentation - Recommend daily broad spectrum sunscreen SPF 30+ to sun-exposed areas, reapply every 2 hours as needed.  - Staying in the shade or wearing long sleeves, sun glasses (UVA+UVB protection) and wide brim hats (4-inch brim around the entire circumference of the hat) are also recommended for sun protection.  - Call for new or changing lesions.  LENTIGINES, SEBORRHEIC KERATOSES, HEMANGIOMAS - Benign normal skin lesions - Benign-appearing - Call for any changes  MELANOCYTIC NEVI - Tan-brown and/or pink-flesh-colored symmetric macules and papules,  including flesh papules at left anterior neck, back , right chest   - Benign appearing on exam today - Observation - Call clinic for new or changing moles - Recommend daily use of broad spectrum spf 30+ sunscreen to sun-exposed areas.    Psoriasis vrs Seb Derm Occipital Scalp Exam Clear today at scalp <1% BSA    Chronic condition with duration or expected duration over one year. Currently well-controlled.    Seborrheic Dermatitis  -  is a chronic persistent rash characterized by pinkness and scaling most commonly of the mid face but also can occur on the scalp (dandruff), ears; mid chest, mid back and groin.  It tends to be exacerbated by stress and cooler weather.  People who have neurologic disease may experience new onset or exacerbation of existing seborrheic dermatitis.  The condition is not curable but treatable and can be controlled.    Psoriasis is a chronic non-curable, but treatable genetic/hereditary disease that may have other systemic features affecting other organ systems such as joints (Psoriatic Arthritis). It is associated with an increased risk of inflammatory bowel disease, heart disease, non-alcoholic fatty liver disease, and depression.   Treatment Plan:  Continue Clobetasol solution qd/bid to aa scalp prn itchy rash Patient will call for refills   Recommend OTC Head & Shoulders shampoo 2-3x per week, massage into scalp and let sit 3-5 minutes before rinsing.     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  - Call if  any new or changing lesions are noted between office visits   Return in about 1 year (around 01/07/2024) for TBSE.  I, Asher Muir, CMA, am acting as scribe for Willeen Niece, MD.   Documentation: I have reviewed the above  documentation for accuracy and completeness, and I agree with the above.  Willeen Niece, MD

## 2023-01-07 NOTE — Patient Instructions (Addendum)
For Scalp  Continue  as needed Clobetasol solution daily to twice daily  to affected areas of scalp as needed for itchy rash   Recommend OTC Head & Shoulders shampoo 2-3x per week, massage into scalp and let sit 3-5 minutes before rinsing.   Recommend DEET containing insect repellant (25% DEET or higher) such as [place] Off or Repel Sportsmen and wear protective clothing when outdoors.     Melanoma ABCDEs  Melanoma is the most dangerous type of skin cancer, and is the leading cause of death from skin disease.  You are more likely to develop melanoma if you: Have light-colored skin, light-colored eyes, or red or blond hair Spend a lot of time in the sun Tan regularly, either outdoors or in a tanning bed Have had blistering sunburns, especially during childhood Have a close family member who has had a melanoma Have atypical moles or large birthmarks  Early detection of melanoma is key since treatment is typically straightforward and cure rates are extremely high if we catch it early.   The first sign of melanoma is often a change in a mole or a new dark spot.  The ABCDE system is a way of remembering the signs of melanoma.  A for asymmetry:  The two halves do not match. B for border:  The edges of the growth are irregular. C for color:  A mixture of colors are present instead of an even brown color. D for diameter:  Melanomas are usually (but not always) greater than 6mm - the size of a pencil eraser. E for evolution:  The spot keeps changing in size, shape, and color.  Please check your skin once per month between visits. You can use a small mirror in front and a large mirror behind you to keep an eye on the back side or your body.   If you see any new or changing lesions before your next follow-up, please call to schedule a visit.  Please continue daily skin protection including broad spectrum sunscreen SPF 30+ to sun-exposed areas, reapplying every 2 hours as needed when  you're outdoors.   Staying in the shade or wearing long sleeves, sun glasses (UVA+UVB protection) and wide brim hats (4-inch brim around the entire circumference of the hat) are also recommended for sun protection.     Due to recent changes in healthcare laws, you may see results of your pathology and/or laboratory studies on MyChart before the doctors have had a chance to review them. We understand that in some cases there may be results that are confusing or concerning to you. Please understand that not all results are received at the same time and often the doctors may need to interpret multiple results in order to provide you with the best plan of care or course of treatment. Therefore, we ask that you please give [place] 2 business days to thoroughly review all your results before contacting the office for clarification. Should we see a critical lab result, you will be contacted sooner.   If You Need Anything After Your Visit  If you have any questions or concerns for your doctor, please call our main line at [phone] and press option 4 to reach your doctor's medical assistant. If no one answers, please leave a voicemail as directed and we will return your call as soon as possible. Messages left after 4 pm will be answered the following business day.   You may also send [place] a message via MyChart. We typically respond  to MyChart messages within 1-2 business days.  For prescription refills, please ask your pharmacy to contact our office. Our fax number is [phone].  If you have an urgent issue when the clinic is closed that cannot wait until the next business day, you can page your doctor at the number below.    Please note that while we do our best to be available for urgent issues outside of office hours, we are not available 24/7.   If you have an urgent issue and are unable to reach [place], you may choose to seek medical care at your doctor's office, retail clinic, urgent care center, or  emergency room.  If you have a medical emergency, please immediately call 911 or go to the emergency department.  Pager Numbers  - Dr. Gwen Pounds: [phone]  - Dr. Roseanne Reno: [phone]  - Dr. Katrinka Blazing: [phone]   In the event of inclement weather, please call our main line at [phone] for an update on the status of any delays or closures.  Dermatology Medication Tips: Please keep the boxes that topical medications come in in order to help keep track of the instructions about where and how to use these. Pharmacies typically print the medication instructions only on the boxes and not directly on the medication tubes.   If your medication is too expensive, please contact our office at [phone] option 4 or send [place] a message through MyChart.   We are unable to tell what your co-pay for medications will be in advance as this is different depending on your insurance coverage. However, we may be able to find a substitute medication at lower cost or fill out paperwork to get insurance to cover a needed medication.   If a prior authorization is required to get your medication covered by your insurance company, please allow [place] 1-2 business days to complete this process.  Drug prices often vary depending on where the prescription is filled and some pharmacies may offer cheaper prices.  The website www.goodrx.com contains coupons for medications through different pharmacies. The prices here do not account for what the cost may be with help from insurance (it may be cheaper with your insurance), but the website can give you the price if you did not use any insurance.  - You can print the associated coupon and take it with your prescription to the pharmacy.  - You may also stop by our office during regular business hours and pick up a GoodRx coupon card.  - If you need your prescription sent electronically to a different pharmacy, notify our office through [facility] or by phone at  [phone] option 4.     Si Usted Necesita Algo Despus de Su Visita  Tambin puede enviarnos un mensaje a travs de Clinical cytogeneticist. Por lo general respondemos a los mensajes de MyChart en el transcurso de 1 a 2 das hbiles.  Para renovar recetas, por favor pida a su farmacia que se ponga en contacto con nuestra oficina. Annie Sable de fax es [place] [phone].  Si tiene un asunto urgente cuando la clnica est cerrada y que no puede esperar hasta el siguiente da hbil, puede llamar/localizar a su doctor(a) al nmero que aparece a continuacin.   Por favor, tenga en cuenta que aunque hacemos todo lo posible para estar disponibles para asuntos urgentes fuera del horario de [place], no estamos disponibles las 24 horas del da, los 7 [address]  [address] de la [place].   Si tiene un problema urgente y no puede comunicarse con  nosotros, puede optar por buscar atencin mdica  en el consultorio de su doctor(a), en una clnica privada, en un centro de atencin urgente o en una sala de emergencias.  Si tiene Engineer, drilling, por favor llame inmediatamente al 911 o vaya a la sala de emergencias.  Nmeros de bper  - Dr. Gwen Pounds: [phone]  - Dra. Roseanne Reno: [phone]  - Dr. Katrinka Blazing: [phone]   En caso de inclemencias del tiempo, por favor llame a Lacy Duverney principal al [phone] para una actualizacin sobre el [place] de cualquier retraso o cierre.  Consejos para la medicacin en dermatologa: Por favor, guarde las cajas en las que vienen los medicamentos de uso tpico para ayudarle a seguir las instrucciones sobre dnde y cmo usarlos. Las farmacias generalmente imprimen las instrucciones del medicamento slo en las cajas y no directamente en los tubos del [place].   Si su medicamento es muy caro, por favor, pngase en contacto con Rolm Gala llamando al [phone] y presione la opcin 4 o envenos un mensaje a travs de Clinical cytogeneticist.   No podemos decirle cul ser su copago por los  medicamentos por adelantado ya que esto es diferente dependiendo de la cobertura de su seguro. Sin embargo, es posible que podamos encontrar un medicamento sustituto a Audiological scientist un formulario para que el seguro cubra el medicamento que se considera necesario.   Si se requiere una autorizacin previa para que su compaa de seguros [place] su medicamento, por favor permtanos de 1 a 2 das hbiles para completar [address]  Los precios de los medicamentos varan con frecuencia dependiendo del Environmental consultant de dnde se surte la receta y alguna farmacias pueden ofrecer precios ms baratos.  El sitio web www.goodrx.com tiene cupones para medicamentos de Health and safety inspector. Los precios aqu no tienen en cuenta lo que podra costar con la ayuda del seguro (puede ser ms barato con su seguro), pero el sitio web puede darle el precio si no utiliz Tourist information centre manager.  - Puede imprimir el cupn correspondiente y llevarlo con su receta a la farmacia.  - Tambin puede pasar por nuestra oficina durante el horario de atencin regular y Education officer, museum una tarjeta de cupones de GoodRx.  - Si necesita que su receta se enve electrnicamente a una farmacia diferente, informe a nuestra oficina a travs de MyChart de [place] o por telfono llamando al [phone] y presione la opcin 4.

## 2023-11-19 ENCOUNTER — Other Ambulatory Visit: Payer: Self-pay | Admitting: Internal Medicine

## 2023-11-19 DIAGNOSIS — Z1231 Encounter for screening mammogram for malignant neoplasm of breast: Secondary | ICD-10-CM

## 2023-12-10 ENCOUNTER — Ambulatory Visit
Admission: RE | Admit: 2023-12-10 | Discharge: 2023-12-10 | Disposition: A | Discharge status: Home / Self Care | Source: Ambulatory Visit | Attending: Internal Medicine | Admitting: Internal Medicine

## 2023-12-10 DIAGNOSIS — Z1231 Encounter for screening mammogram for malignant neoplasm of breast: Secondary | ICD-10-CM | POA: Diagnosis present

## 2023-12-26 ENCOUNTER — Encounter: Payer: Self-pay | Admitting: Gastroenterology

## 2023-12-26 ENCOUNTER — Other Ambulatory Visit: Payer: Self-pay

## 2023-12-26 NOTE — Anesthesia Preprocedure Evaluation (Addendum)
 Anesthesia Evaluation  Patient identified by MRN, date of birth, ID band Patient awake    Reviewed: Allergy & Precautions, H&P , NPO status , Patient's Chart, lab work & pertinent test results  Airway Mallampati: I  TM Distance: >3 FB Neck ROM: Full    Dental no notable dental hx.    Pulmonary neg pulmonary ROS   Pulmonary exam normal breath sounds clear to auscultation       Cardiovascular negative cardio ROS Normal cardiovascular exam+ dysrhythmias  Rhythm:Regular Rate:Normal  12-18-22  STRESS ECG RESULTS -------------------------------------------------------------------             Note: Normal ECG results without sig ST-T changes                             INTERPRETATION -----------------------------------------------------------------------  NORMAL STRESS ECHOCARDIOGRAM.   Maximum workload of 10.1 METs was achieved during exercise.  NORMAL RESTING BP - APPROPRIATE RESPONSE  Grade I diastolic dysfunction RESTING COMMENTS ---------------------------------------------------------------------  Mild MVP with moderate MR  NORMAL LA PRESSURES WITH DIASTOLIC DYSFUNCTION  VALVULAR REGURGITATION: TRIVIAL AR, MODERATE MR, TRIVIAL PR, TRIVIAL TR  NO VALVULAR STENOSIS     Neuro/Psych  PSYCHIATRIC DISORDERS Anxiety  Bipolar Disorder   negative neurological ROS  negative psych ROS   GI/Hepatic negative GI ROS, Neg liver ROS,GERD  ,,  Endo/Other  negative endocrine ROS    Renal/GU negative Renal ROS  negative genitourinary   Musculoskeletal negative musculoskeletal ROS (+)    Abdominal   Peds negative pediatric ROS (+)  Hematology negative hematology ROS (+) Blood dyscrasia, anemia   Anesthesia Other Findings Melanoma (HCC)  Dysrhythmia Mild mitral valve prolapse  Moderate tricuspid regurgitation by prior echocardiogram Frequent PVCs  History of melanoma in situ Grade I diastolic dysfunction      Reproductive/Obstetrics negative OB ROS                              Anesthesia Physical Anesthesia Plan  ASA: 3  Anesthesia Plan: General   Post-op Pain Management:    Induction: Intravenous  PONV Risk Score and Plan:   Airway Management Planned: Natural Airway and Nasal Cannula  Additional Equipment:   Intra-op Plan:   Post-operative Plan:   Informed Consent: I have reviewed the patients History and Physical, chart, labs and discussed the procedure including the risks, benefits and alternatives for the proposed anesthesia with the patient or authorized representative who has indicated his/her understanding and acceptance.     Dental Advisory Given  Plan Discussed with: Anesthesiologist, CRNA and Surgeon  Anesthesia Plan Comments: (Patient consented for risks of anesthesia including but not limited to:  - adverse reactions to medications - risk of airway placement if required - damage to eyes, teeth, lips or other oral mucosa - nerve damage due to positioning  - sore throat or hoarseness - Damage to heart, brain, nerves, lungs, other parts of body or loss of life  Patient voiced understanding and assent.)         Anesthesia Quick Evaluation

## 2023-12-31 ENCOUNTER — Encounter
Admission: RE | Disposition: A | Discharge status: Home / Self Care | Payer: Self-pay | Source: Home / Self Care | Attending: Gastroenterology

## 2023-12-31 ENCOUNTER — Encounter: Payer: Self-pay | Admitting: Gastroenterology

## 2023-12-31 ENCOUNTER — Ambulatory Visit: Payer: Self-pay | Admitting: Anesthesiology

## 2023-12-31 ENCOUNTER — Ambulatory Visit
Admission: RE | Admit: 2023-12-31 | Discharge: 2023-12-31 | Disposition: A | Discharge status: Home / Self Care | Attending: Gastroenterology | Admitting: Gastroenterology

## 2023-12-31 ENCOUNTER — Other Ambulatory Visit: Payer: Self-pay

## 2023-12-31 DIAGNOSIS — I341 Nonrheumatic mitral (valve) prolapse: Secondary | ICD-10-CM | POA: Insufficient documentation

## 2023-12-31 DIAGNOSIS — K219 Gastro-esophageal reflux disease without esophagitis: Secondary | ICD-10-CM | POA: Insufficient documentation

## 2023-12-31 DIAGNOSIS — Z1211 Encounter for screening for malignant neoplasm of colon: Secondary | ICD-10-CM | POA: Insufficient documentation

## 2023-12-31 DIAGNOSIS — Z5941 Food insecurity: Secondary | ICD-10-CM | POA: Insufficient documentation

## 2023-12-31 DIAGNOSIS — Z59869 Financial insecurity, unspecified: Secondary | ICD-10-CM | POA: Diagnosis not present

## 2023-12-31 HISTORY — DX: Other ill-defined heart diseases: I51.89

## 2023-12-31 HISTORY — DX: Personal history of melanoma in-situ: Z86.006

## 2023-12-31 HISTORY — DX: Ventricular premature depolarization: I49.3

## 2023-12-31 HISTORY — DX: Nonrheumatic mitral (valve) prolapse: I34.1

## 2023-12-31 HISTORY — DX: Cardiac arrhythmia, unspecified: I49.9

## 2023-12-31 HISTORY — PX: COLONOSCOPY: SHX5424

## 2023-12-31 HISTORY — DX: Rheumatic tricuspid insufficiency: I07.1

## 2023-12-31 SURGERY — COLONOSCOPY
Anesthesia: General

## 2023-12-31 MED ORDER — LIDOCAINE HCL (CARDIAC) PF 100 MG/5ML IV SOSY
PREFILLED_SYRINGE | INTRAVENOUS | Status: DC | PRN
  Administered 2023-12-31: 50 mg via INTRAVENOUS

## 2023-12-31 MED ORDER — LIDOCAINE HCL (PF) 2 % IJ SOLN
INTRAMUSCULAR | Status: AC
  Filled 2023-12-31: qty 5

## 2023-12-31 MED ORDER — PROPOFOL 500 MG/50ML IV EMUL
INTRAVENOUS | Status: DC | PRN
  Administered 2023-12-31: 100 ug/kg/min via INTRAVENOUS

## 2023-12-31 MED ORDER — PROPOFOL 10 MG/ML IV BOLUS
INTRAVENOUS | Status: AC
  Filled 2023-12-31: qty 40

## 2023-12-31 MED ORDER — LACTATED RINGERS IV SOLN: INTRAVENOUS | Status: DC

## 2023-12-31 MED ORDER — PROPOFOL 10 MG/ML IV BOLUS
INTRAVENOUS | Status: DC | PRN
  Administered 2023-12-31: 50 mg via INTRAVENOUS

## 2023-12-31 MED ORDER — STERILE WATER FOR IRRIGATION IR SOLN
Status: DC | PRN
  Administered 2023-12-31: 1

## 2023-12-31 SURGICAL SUPPLY — 4 items
GOWN CVR UNV OPN BCK APRN NK (MISCELLANEOUS) ×2 IMPLANT
KIT PROCEDURE OLYMPUS (MISCELLANEOUS) ×1 IMPLANT
MANIFOLD NEPTUNE II (INSTRUMENTS) ×1 IMPLANT
WATER STERILE IRR 250ML POUR (IV SOLUTION) ×1 IMPLANT

## 2023-12-31 NOTE — Op Note (Signed)
 [facility] Patient Name: Denise Keith Procedure Date: 12/31/2023 10:46 AM MRN: [id number] Account #: [id number] Date of Birth: 10-Dec-1955 Admit Type: Outpatient Age: 68 Room: [facility] OR ROOM 01 Gender: Female Note Status: Finalized Instrument Name: Peds Colonoscope [id number] Procedure:             Colonoscopy Indications:           Screening for colorectal malignant neoplasm Providers:             Corinn Jess Brooklyn MD, MD Referring MD:          Reyes BIRCH. Auston, MD (Referring MD) Medicines:             General Anesthesia Complications:         No immediate complications. Estimated blood loss: None. Procedure:             Pre-Anesthesia Assessment:                        - Prior to the procedure, a History and Physical was                         performed, and patient medications and allergies were                         reviewed. The patient is competent. The risks and                         benefits of the procedure and the sedation options and                         risks were discussed with the patient. All questions                         were answered and informed consent was obtained.                         Patient identification and proposed procedure were                         verified by the physician, the nurse, the                         anesthesiologist, the anesthetist and the technician                         in the pre-procedure area in the procedure room in the                         endoscopy suite. Mental Status Examination: alert and                         oriented. Airway Examination: normal oropharyngeal                         airway and neck mobility. Respiratory Examination:                         clear to auscultation. CV Examination: normal.  Prophylactic Antibiotics: The patient does not require                         prophylactic antibiotics. Prior Anticoagulants: The                          patient has taken no anticoagulant or antiplatelet                         agents. ASA Grade Assessment: III - A patient with                         severe systemic disease. After reviewing the risks and                         benefits, the patient was deemed in satisfactory                         condition to undergo the procedure. The anesthesia                         plan was to use general anesthesia. Immediately prior                         to administration of medications, the patient was                         re-assessed for adequacy to receive sedatives. The                         heart rate, respiratory rate, oxygen saturations,                         blood pressure, adequacy of pulmonary ventilation, and                         response to care were monitored throughout the                         procedure. The physical status of the patient was                         re-assessed after the procedure.                        After obtaining informed consent, the colonoscope was                         passed under direct vision. Throughout the procedure,                         the patient's blood pressure, pulse, and oxygen                         saturations were monitored continuously. The                         Colonoscope was introduced through the anus and  advanced to the the cecum, identified by appendiceal                         orifice and ileocecal valve. The colonoscopy was                         performed without difficulty. The patient tolerated                         the procedure well. The quality of the bowel                         preparation was evaluated using the BBPS [facility] Bowel                         Preparation Scale) with scores of: Right Colon = 3,                         Transverse Colon = 3 and Left Colon = 3 (entire mucosa                         seen well with no residual staining, small fragments                          of stool or opaque liquid). The total BBPS score                         equals 9. The ileocecal valve, appendiceal orifice,                         and rectum were photographed. Findings:      The perianal and digital rectal examinations were normal. Pertinent       negatives include normal sphincter tone and no palpable rectal lesions.      The entire examined colon appeared normal.      The retroflexed view of the distal rectum and anal verge was normal and       showed no anal or rectal abnormalities. Impression:            - The entire examined colon is normal.                        - The distal rectum and anal verge are normal on                         retroflexion view.                        - No specimens collected. Recommendation:        - Discharge patient to home (with escort).                        - Resume previous diet today.                        - Continue present medications.                        - Repeat colonoscopy in  10 years for screening                         purposes. Procedure Code(s):     --- Professional ---                        H9878, Colorectal cancer screening; colonoscopy on                         individual not meeting criteria for high risk Diagnosis Code(s):     --- Professional ---                        Z12.11, Encounter for screening for malignant neoplasm                         of colon CPT copyright 2022 American Medical Association. All rights reserved. The codes documented in this report are preliminary and upon coder review may  be revised to meet current compliance requirements. Dr. Corinn Brooklyn Corinn Jess Brooklyn MD, MD 12/31/2023 11:10:12 AM This report has been signed electronically. Number of Addenda: 0 Note Initiated On: 12/31/2023 10:46 AM Scope Withdrawal Time: 0 hours 5 minutes 57 seconds  Total Procedure Duration: 0 hours 9 minutes 25 seconds  Estimated Blood Loss:  Estimated blood loss: none.       [facility]

## 2023-12-31 NOTE — Anesthesia Postprocedure Evaluation (Signed)
 Anesthesia Post Note  Patient: Denise Keith  Procedure(s) Performed: COLONOSCOPY  Patient location during evaluation: PACU Anesthesia Type: General Level of consciousness: awake and alert Pain management: pain level controlled Vital Signs Assessment: post-procedure vital signs reviewed and stable Respiratory status: spontaneous breathing, nonlabored ventilation, respiratory function stable and patient connected to nasal cannula oxygen Cardiovascular status: blood pressure returned to baseline and stable Postop Assessment: no apparent nausea or vomiting Anesthetic complications: no   No notable events documented.   Last Vitals:  Vitals:   12/31/23 1115 12/31/23 1121  BP: 100/60 110/77  Pulse: 78 79  Resp: (!) 0 (!) 24  Temp:  (!) 36.1 C  SpO2: 94% 100%    Last Pain:  Vitals:   12/31/23 1121  TempSrc:   PainSc: 0-No pain                 Joash Tony C Tranquilino Fischler

## 2023-12-31 NOTE — H&P (Signed)
 Denise JONELLE Brooklyn, MD [facility], DHIP [address]  [place], [place] [address]  Main: [phone] Fax:  [phone] Pager: [phone]   Primary Care Physician:  Auston Reyes BIRCH, MD Primary Gastroenterologist:  Dr. Corinn JONELLE Keith  Pre-Procedure History & Physical: HPI:  Denise Keith is a 68 y.o. female is here for an colonoscopy.   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 Authorizing Provider  buPROPion (WELLBUTRIN XL) 300 MG 24 hr tablet Wellbutrin XL 300 mg 24 hr tablet, extended release  Take 1 tablet every day by oral route.   Yes [provider]  clobetasol  (TEMOVATE ) 0.05 % external solution Apply 1 Application topically See admin instructions. Use 1 - 2 times daily for itchy rash at scalp 12/27/21  Yes Jackquline Sawyer, MD  diazepam (VALIUM) 10 MG tablet Valium 10 mg tablet  Take 1 tablet 3 times a day by oral route.   Yes [provider]  lamoTRIgine (LAMICTAL) 200 MG tablet Take by mouth.   Yes [provider]  omeprazole (PRILOSEC) 20 MG capsule Take 20 mg by mouth daily.   Yes [provider]    Allergies as of 12/24/2023 - Review Complete 01/07/2023  Allergen Reaction Noted   Morphine Shortness Of Breath 12/31/2013   Codeine Itching 12/31/2013   Nsaids  03/24/2018   Oxycodone-acetaminophen  03/24/2018   Prednisone Other (See Comments) 01/01/2014   Promethazine Other (See Comments)  12/31/2013   Promethazine hcl  03/24/2018   Propoxyphene Itching 12/31/2013   Doxycycline hyclate Rash 12/31/2013   Penicillins Rash 12/31/2013   Vancomycin Rash 12/31/2013    Family History  Problem Relation Age of Onset   Breast cancer Mother 68   Cancer Mother    Stroke Father     Social History   Socioeconomic History   Marital status: Married    Spouse name: Not on file   Number of children: Not on file   Years of education: Not on file   Highest education level: Not on file  Occupational History   Not on file  Tobacco Use   Smoking status: Never   Smokeless tobacco: Never  Vaping Use   Vaping status: Never Used  Substance and Sexual Activity   Alcohol use: Yes    Alcohol/week: 56.0 standard drinks of alcohol    Types: 56 Cans of beer per week   Drug use: Not on file   Sexual activity: Not on file  Other Topics Concern   Not on file  Social History Narrative   Not on file   Social Drivers of Health   Financial Resource Strain: Patient Unable To Answer (12/23/2023)   Received from [facility]   Overall Financial Resource Strain (CARDIA)    Difficulty of Paying Living Expenses: Patient unable to answer  Recent Concern: Financial Resource Strain - High Risk (  11/19/2023)   Received from [facility]   Overall Financial Resource Strain (CARDIA)    Difficulty of Paying Living Expenses: Hard  Food Insecurity: Patient Unable To Answer (12/23/2023)   Received from [facility]   Hunger Vital Sign    Within the past 12 months, you worried that your food would run out before you got the money to buy more.: Patient unable to answer    Within the past 12 months, the food you bought just didn't last and you didn't have money to get more.: Patient unable to answer  Recent Concern: Food Insecurity - Food Insecurity Present (11/19/2023)   Received from [facility]   Hunger Vital Sign    Within the past  12 months, you worried that your food would run out before you got the money to buy more.: Never true    Within the past 12 months, the food you bought just didn't last and you didn't have money to get more.: Sometimes true  Transportation Needs: Patient Unable To Answer (12/23/2023)   Received from [facility]   PRAPARE - Transportation    In the past 12 months, has lack of transportation kept you from medical appointments or from getting medications?: Patient unable to answer    Lack of Transportation (Non-Medical): Patient unable to answer  Physical Activity: Not on file  Stress: Not on file  Social Connections: Not on file  Intimate Partner Violence: Not on file    Review of Systems: See HPI, otherwise negative ROS  Physical Exam: BP (!) 150/64   Pulse 88   Temp 97.6 F (36.4 C) (Temporal)   Resp (!) 26   Ht 5' 2 (1.575 m)   Wt 52.2 kg   SpO2 96%   BMI 21.05 kg/m  General:   Alert,  pleasant and cooperative in NAD Head:  Normocephalic and atraumatic. Neck:  Supple; no masses or thyromegaly. Lungs:  Clear throughout to auscultation.    Heart:  Regular rate and rhythm. Abdomen:  Soft, nontender and nondistended. Normal bowel sounds, without guarding, and without rebound.   Neurologic:  Alert and  oriented x4;  grossly normal neurologically.  Impression/Plan: Denise Keith is here for an colonoscopy to be performed for colon cancer screening  Risks, benefits, limitations, and alternatives regarding  colonoscopy have been reviewed with the patient.  Questions have been answered.  All parties agreeable.   Denise Brooklyn, MD  12/31/2023, 9:48 AM

## 2023-12-31 NOTE — Transfer of Care (Signed)
 Immediate Anesthesia Transfer of Care Note  Patient: Denise Keith  Procedure(s) Performed: COLONOSCOPY  Patient Location: PACU  Anesthesia Type: General  Level of Consciousness: awake, alert  and patient cooperative  Airway and Oxygen Therapy: Patient Spontanous Breathing and Patient connected to supplemental oxygen  Post-op Assessment: Post-op Vital signs reviewed, Patient's Cardiovascular Status Stable, Respiratory Function Stable, Patent Airway and No signs of Nausea or vomiting  Post-op Vital Signs: Reviewed and stable  Complications: No notable events documented.

## 2024-01-13 ENCOUNTER — Ambulatory Visit: Payer: Medicare Other | Admitting: Dermatology

## 2024-01-15 ENCOUNTER — Ambulatory Visit: Admitting: Dermatology

## 2024-03-03 ENCOUNTER — Ambulatory Visit: Admitting: Dermatology

## 2024-03-03 DIAGNOSIS — L578 Other skin changes due to chronic exposure to nonionizing radiation: Secondary | ICD-10-CM | POA: Diagnosis not present

## 2024-03-03 DIAGNOSIS — Z86006 Personal history of melanoma in-situ: Secondary | ICD-10-CM

## 2024-03-03 DIAGNOSIS — W908XXA Exposure to other nonionizing radiation, initial encounter: Secondary | ICD-10-CM | POA: Diagnosis not present

## 2024-03-03 DIAGNOSIS — L821 Other seborrheic keratosis: Secondary | ICD-10-CM | POA: Diagnosis not present

## 2024-03-03 DIAGNOSIS — L409 Psoriasis, unspecified: Secondary | ICD-10-CM | POA: Diagnosis not present

## 2024-03-03 DIAGNOSIS — D1801 Hemangioma of skin and subcutaneous tissue: Secondary | ICD-10-CM | POA: Diagnosis not present

## 2024-03-03 DIAGNOSIS — Z1283 Encounter for screening for malignant neoplasm of skin: Secondary | ICD-10-CM

## 2024-03-03 DIAGNOSIS — L814 Other melanin hyperpigmentation: Secondary | ICD-10-CM | POA: Diagnosis not present

## 2024-03-03 DIAGNOSIS — D229 Melanocytic nevi, unspecified: Secondary | ICD-10-CM

## 2024-03-03 MED ORDER — CLOBETASOL PROPIONATE 0.05 % EX SOLN: 1.0000 | CUTANEOUS | 6 refills | Status: AC

## 2024-03-03 NOTE — Progress Notes (Unsigned)
 "  Follow-Up Visit   Subjective  Denise Keith is a 68 y.o. female who presents for the following: Skin Cancer Screening and Full Body Skin Exam. History of MMIS right mid sternum, 2018.  The patient presents for Total-Body Skin Exam (TBSE) for skin cancer screening and mole check. The patient has spots, moles and lesions to be evaluated, some may be new or changing. Psoriasis vs Seb Derm of the occipital scalp, needs clobetasol  solution Rx sent in.   The following portions of the chart were reviewed this encounter and updated as appropriate: medications, allergies, medical history  Review of Systems:  No other skin or systemic complaints except as noted in HPI or Assessment and Plan.  Objective  Well appearing patient in no apparent distress; mood and affect are within normal limits.  A full examination was performed including scalp, head, eyes, ears, nose, lips, neck, chest, axillae, abdomen, back, buttocks, bilateral upper extremities, bilateral lower extremities, hands, feet, fingers, toes, fingernails, and toenails. All findings within normal limits unless otherwise noted below.   Relevant physical exam findings are noted in the Assessment and Plan.    Assessment & Plan   SKIN CANCER SCREENING PERFORMED TODAY.  ACTINIC DAMAGE - Chronic condition, secondary to cumulative UV/sun exposure - diffuse scaly erythematous macules with underlying dyspigmentation - Recommend daily broad spectrum sunscreen SPF 30+ to sun-exposed areas, reapply every 2 hours as needed.  - Staying in the shade or wearing long sleeves, sun glasses (UVA+UVB protection) and wide brim hats (4-inch brim around the entire circumference of the hat) are also recommended for sun protection.  - Call for new or changing lesions.  LENTIGINES, SEBORRHEIC KERATOSES, HEMANGIOMAS - Benign normal skin lesions - Benign-appearing - Call for any changes  MELANOCYTIC NEVI - Tan-brown and/or pink-flesh-colored symmetric  macules and papules - Benign appearing on exam today - Observation - Call clinic for new or changing moles - Recommend daily use of broad spectrum spf 30+ sunscreen to sun-exposed areas.   Psoriasis vrs Seb Derm Occipital Scalp Exam: Mild erythema and scale at occipital hairline. <1% BSA   Chronic and persistent condition with duration or expected duration over one year. Condition is improving with treatment but not currently at goal.     Seborrheic Dermatitis  -  is a chronic persistent rash characterized by pinkness and scaling most commonly of the mid face but also can occur on the scalp (dandruff), ears; mid chest, mid back and groin.  It tends to be exacerbated by stress and cooler weather.  People who have neurologic disease may experience new onset or exacerbation of existing seborrheic dermatitis.  The condition is not curable but treatable and can be controlled.    Psoriasis is a chronic non-curable, but treatable genetic/hereditary disease that may have other systemic features affecting other organ systems such as joints (Psoriatic Arthritis). It is associated with an increased risk of inflammatory bowel disease, heart disease, non-alcoholic fatty liver disease, and depression.   Treatment Plan:  Continue Clobetasol  solution qd/bid to aa scalp prn itchy rash. Avoid face.  Topical steroids (such as triamcinolone, fluocinolone, fluocinonide, mometasone, clobetasol , halobetasol, betamethasone, hydrocortisone) can cause thinning and lightening of the skin if they are used for too long in the same area. Your physician has selected the right strength medicine for your problem and area affected on the body. Please use your medication only as directed by your physician to prevent side effects.    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  - Call if any new or changing lesions are noted between office visit PSORIASIS   This Visit - clobetasol  (TEMOVATE ) 0.05 % external solution - Apply 1 Application topically See admin instructions. Use 1 - 2 times daily for itchy rash at scalp Return in about 1 year (around 03/03/2025) for TBSE, Hx melanoma IS.  IAndrea Keith, CMA, am acting as scribe for Rexene Rattler, MD .   Documentation: I have reviewed the above documentation for accuracy and completeness, and I agree with the above.  Rexene Rattler, MD    "

## 2024-03-03 NOTE — Patient Instructions (Signed)

## 2025-03-09 ENCOUNTER — Ambulatory Visit: Admitting: Dermatology
